# Patient Record
Sex: Female | Born: 1965 | Race: Black or African American | Hispanic: No | Marital: Married | Smoking: Never smoker
Health system: Southern US, Community
[De-identification: ages and names within clinical notes are randomized; demographics above are authoritative.]

## PROBLEM LIST (undated history)

## (undated) DIAGNOSIS — I10 Essential (primary) hypertension: Secondary | ICD-10-CM

## (undated) DIAGNOSIS — L509 Urticaria, unspecified: Secondary | ICD-10-CM

## (undated) DIAGNOSIS — E119 Type 2 diabetes mellitus without complications: Secondary | ICD-10-CM

## (undated) HISTORY — DX: Urticaria, unspecified: L50.9

## (undated) HISTORY — PX: TUBAL LIGATION: SHX77

## (undated) HISTORY — PX: BREAST SURGERY: SHX581

---

## 2001-02-19 ENCOUNTER — Other Ambulatory Visit: Admission: RE | Admit: 2001-02-19 | Discharge: 2001-02-19 | Payer: Self-pay | Admitting: *Deleted

## 2001-04-05 ENCOUNTER — Inpatient Hospital Stay (HOSPITAL_COMMUNITY): Admission: RE | Admit: 2001-04-05 | Discharge: 2001-04-06 | Payer: Self-pay | Admitting: *Deleted

## 2002-06-12 ENCOUNTER — Other Ambulatory Visit: Admission: RE | Admit: 2002-06-12 | Discharge: 2002-06-12 | Payer: Self-pay | Admitting: Obstetrics and Gynecology

## 2002-06-17 ENCOUNTER — Encounter: Payer: Self-pay | Admitting: Obstetrics and Gynecology

## 2002-06-17 ENCOUNTER — Encounter: Admission: RE | Admit: 2002-06-17 | Discharge: 2002-06-17 | Payer: Self-pay | Admitting: Obstetrics and Gynecology

## 2002-09-11 ENCOUNTER — Encounter: Payer: Self-pay | Admitting: Obstetrics and Gynecology

## 2002-09-11 ENCOUNTER — Ambulatory Visit (HOSPITAL_COMMUNITY): Admission: RE | Admit: 2002-09-11 | Discharge: 2002-09-11 | Payer: Self-pay | Admitting: Obstetrics and Gynecology

## 2003-03-23 ENCOUNTER — Ambulatory Visit (HOSPITAL_COMMUNITY): Admission: RE | Admit: 2003-03-23 | Discharge: 2003-03-23 | Payer: Self-pay | Admitting: Obstetrics and Gynecology

## 2003-04-16 ENCOUNTER — Encounter (HOSPITAL_COMMUNITY): Admission: AD | Admit: 2003-04-16 | Discharge: 2003-05-16 | Payer: Self-pay | Admitting: Obstetrics and Gynecology

## 2003-04-21 ENCOUNTER — Encounter: Admission: RE | Admit: 2003-04-21 | Discharge: 2003-04-21 | Payer: Self-pay | Admitting: Obstetrics and Gynecology

## 2003-05-22 ENCOUNTER — Encounter (HOSPITAL_COMMUNITY): Admission: RE | Admit: 2003-05-22 | Discharge: 2003-06-21 | Payer: Self-pay | Admitting: Obstetrics and Gynecology

## 2003-06-01 ENCOUNTER — Inpatient Hospital Stay (HOSPITAL_COMMUNITY): Admission: RE | Admit: 2003-06-01 | Discharge: 2003-06-23 | Payer: Self-pay | Admitting: Obstetrics and Gynecology

## 2003-06-22 ENCOUNTER — Encounter (INDEPENDENT_AMBULATORY_CARE_PROVIDER_SITE_OTHER): Payer: Self-pay | Admitting: *Deleted

## 2003-06-24 ENCOUNTER — Encounter: Admission: RE | Admit: 2003-06-24 | Discharge: 2003-07-24 | Payer: Self-pay | Admitting: Obstetrics and Gynecology

## 2003-07-25 ENCOUNTER — Encounter: Admission: RE | Admit: 2003-07-25 | Discharge: 2003-08-24 | Payer: Self-pay | Admitting: Obstetrics and Gynecology

## 2003-08-03 ENCOUNTER — Other Ambulatory Visit: Admission: RE | Admit: 2003-08-03 | Discharge: 2003-08-03 | Payer: Self-pay | Admitting: Obstetrics and Gynecology

## 2004-03-06 IMAGING — US US OB FOLLOW-UP
1 series · 18 of 28 positions shown · non-contrast
Comparison: none

CLINICAL DATA: 25 week 4 day assigned gestational age.  Measuring large for dates.  Premature rupture of membranes.  Evaluate fetal growth, biophysical profile and dopplers.

[Series 1: us ob re-eval · 18 of 29 slices shown]
[im 1/29]
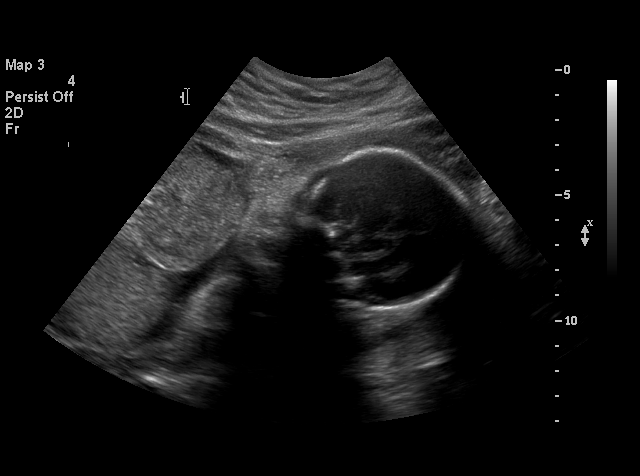
[im 3/29]
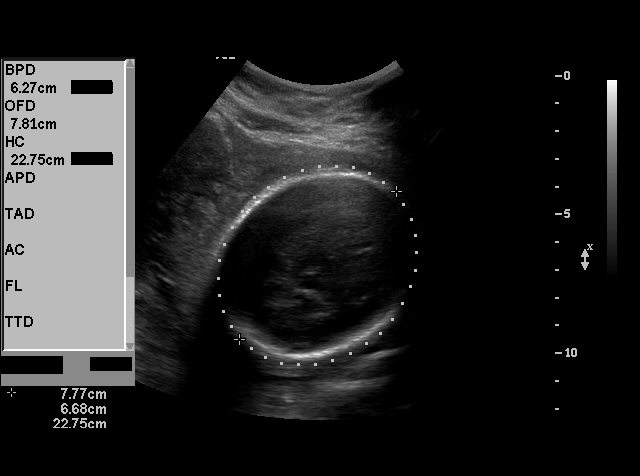
[im 4/29]
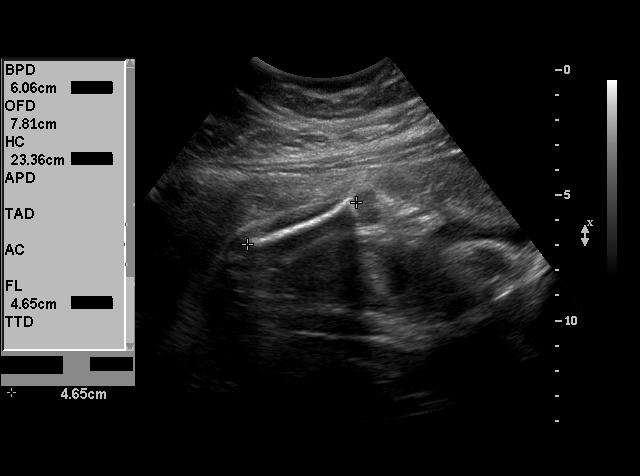
[im 6/29]
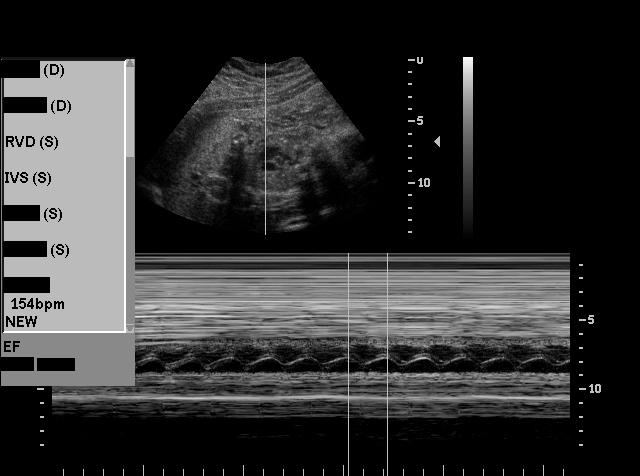
[im 8/29]
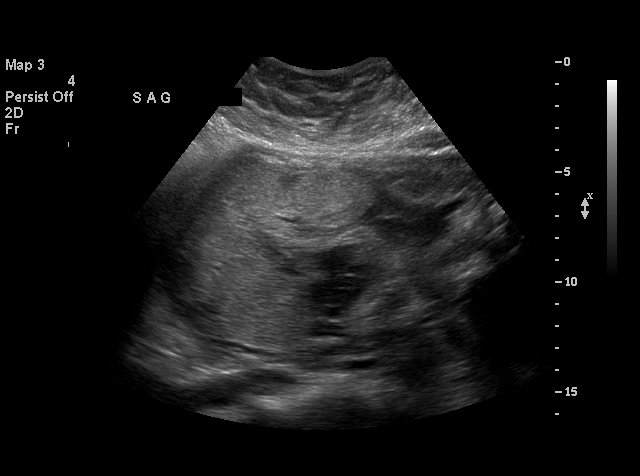
[im 9/29]
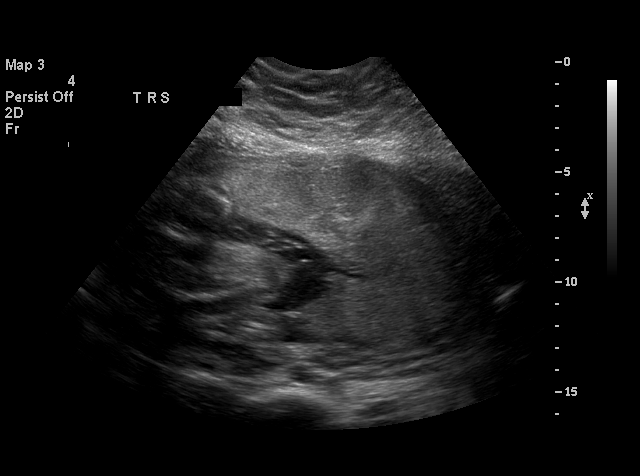
[im 11/29]
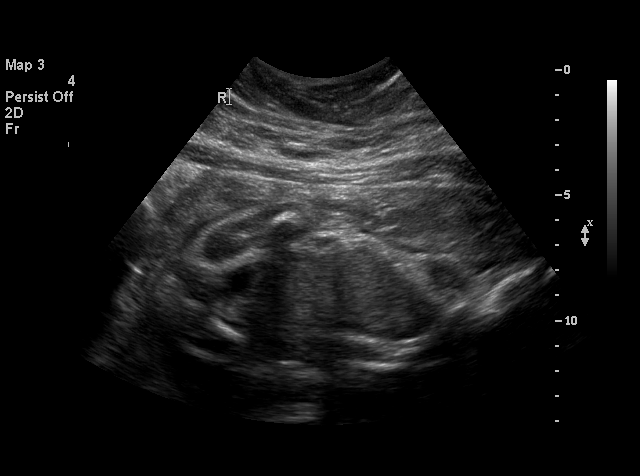
[im 12/29]
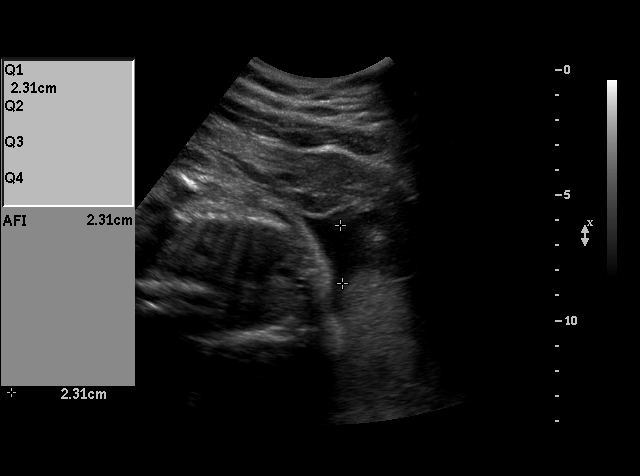
[im 14/29]
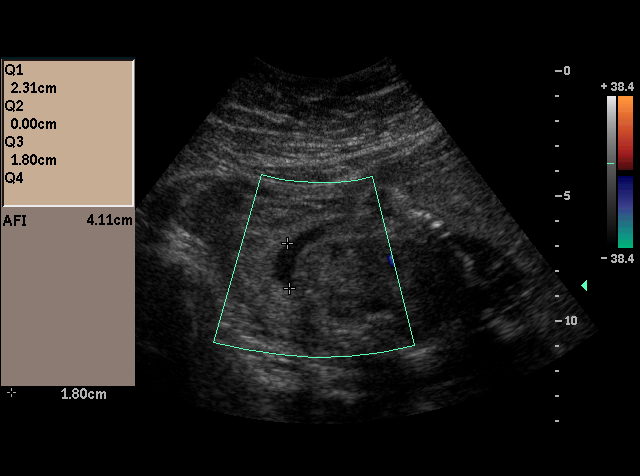
[im 15/29]
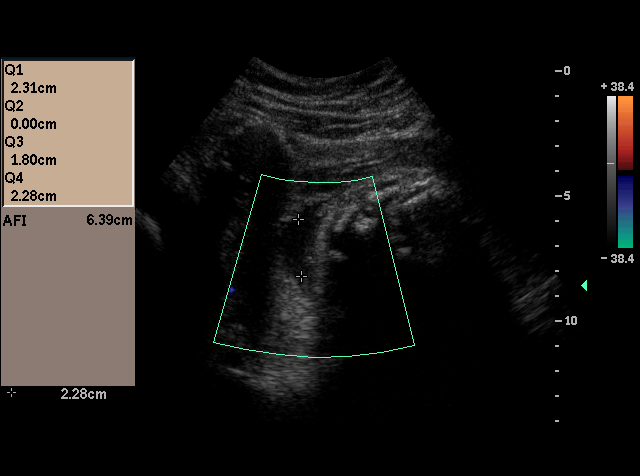
[im 17/29]
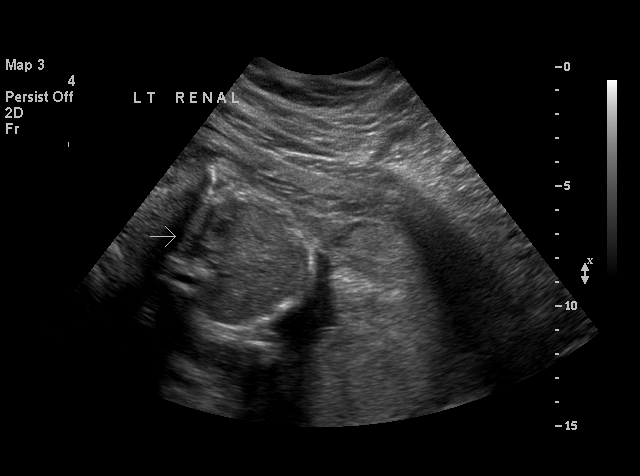
[im 18/29]
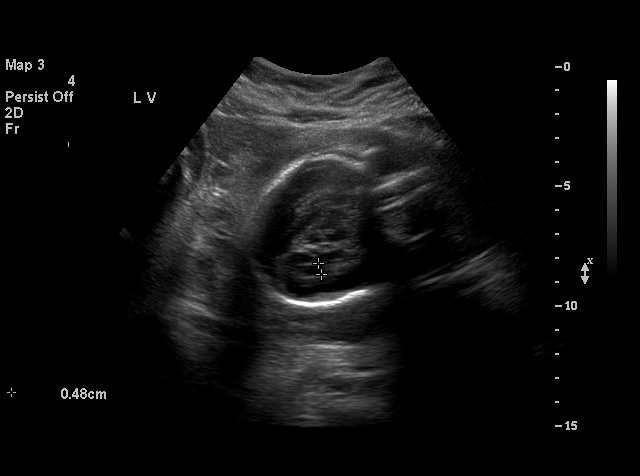
[im 20/29]
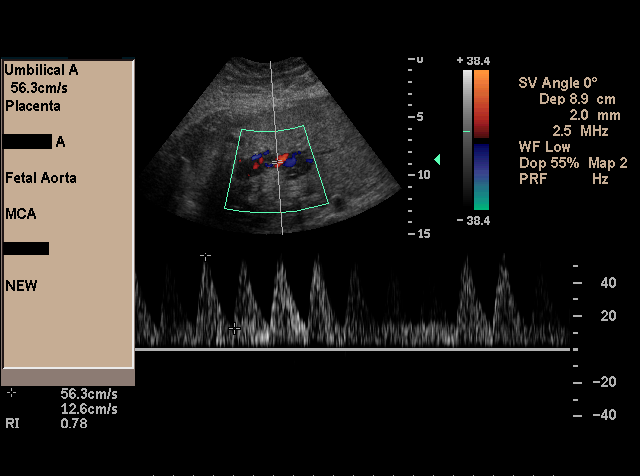
[im 22/29]
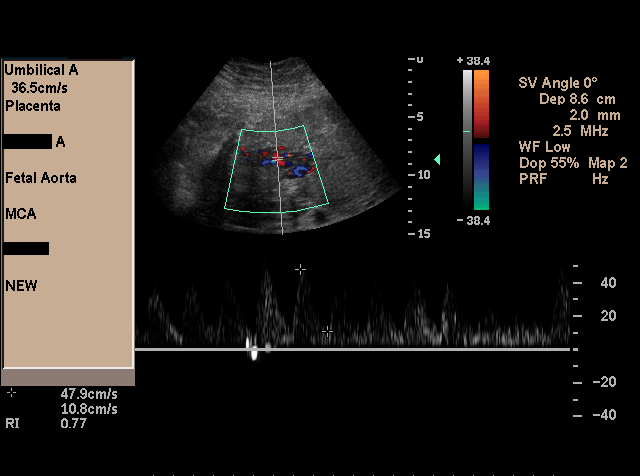
[im 23/29]
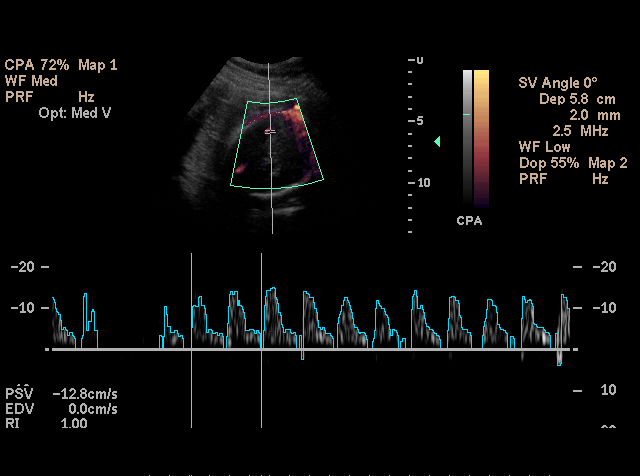
[im 25/29]
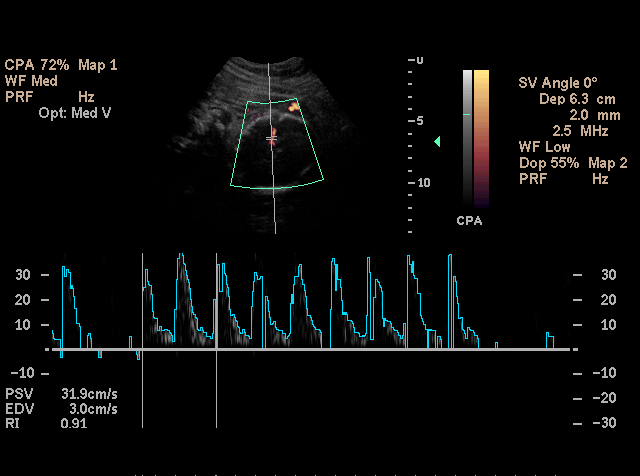
[im 26/29]
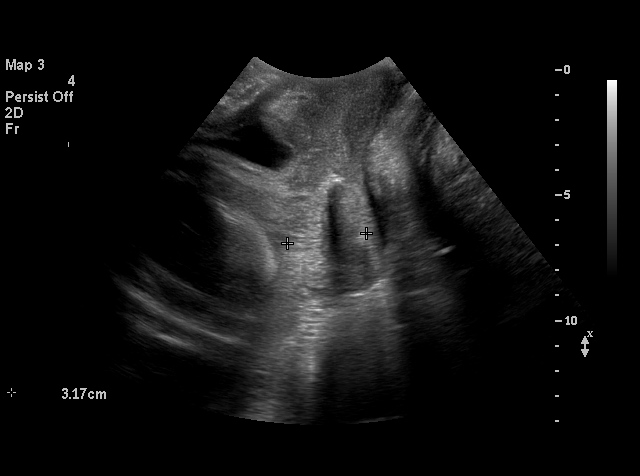
[im 29/29]
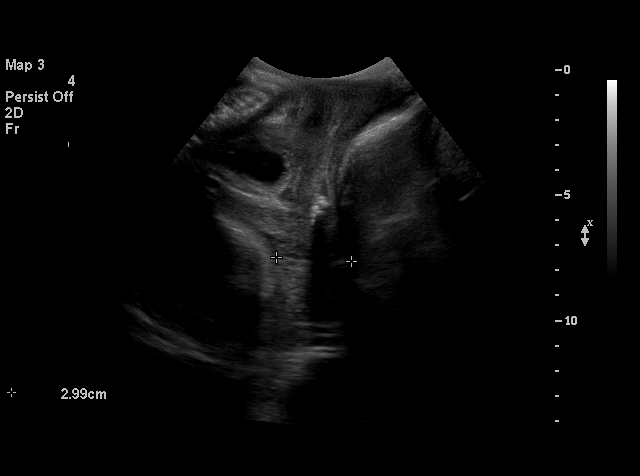

[18 of 28 positions shown; findings below may reference images not displayed]

OBSTETRICAL ULTRASOUND RE-EVALUATION

NUMBER OF FETUSES:  1
HEART RATE:  154
MOVEMENT:  Yes
BREATHING:  Yes
PRESENTATION:  Cephalic
PLACENTAL LOCATION:  Fundal
GRADE:  II
PREVIA:  No
AMNIOTIC FLUID (subjective):  Decreased
AMNIOTIC FLUID (objective):  6.4 cm AFI (5th -95th%ile =   9.7 ? 22.3 cm for 26 weeks

FETAL BIOMETRY
BPD:   6.3 cm  25 w 3 d
HC:  22.8 cm  24 w 6 d
AC:  19.3 cm  24 w 1 d
FL:  4.7 cm  25 w 4 d
MEAN GA:  25 w 0 d
ASSIGNED GA:  25 w 4 d
EFW:  734 g (H) 25th 50th%ile (652 ? 760 g) For 26 weeks

FETAL ANATOMY
LATERAL VENTRICLES:  Visualized 
THALAMI/CSP:  Visualized 
POSTERIOR FOSSA:  Previously seen 
NUCHAL REGION:  N/A
SPINE:  Previously seen 
4 CHAMBER HEART ON LEFT:  Previously seen 
STOMACH ON LEFT:  Visualized 
3 VESSEL CORD:  Previously seen 
CORD INSERTION SITE:  Previously seen 
KIDNEYS:  Visualized 
BLADDER:  Visualized 
EXTREMITIES:  Previously seen 

MATERNAL FINDINGS
CERVIX:  3.0 cm Translabially
IMPRESSION: Assigned gestational age is currently 25 weeks 4 days. Appropriate fetal growth, with EFW between 25th and 50th percentile.
Decreased amniotic fluid volume, with AFI of 6.4 cm.  This is attributed to the patient?s history of ruptured membranes.  
BIOPHYSICAL PROFILE

MOVEMENTS:  2  Time:  20 minutes
BREATHING:  0 (breathing seen, but not sustained)
TONE:  2
AMNIOTIC FLUID:  2
TOTAL SCORE:  6
IMPRESSION
Biophysical profile score [DATE].

DOPPLER ULTRASOUND OF FETUS

UMBILICAL ARTERY
S/D RATIO:  4.23  (NL < 5.00)
MIDDLE CEREBRAL ARTERY
PI:  2.19  (NL > 1.54)
IMPRESSION
Normal fetal doppler measurements.

## 2004-03-08 IMAGING — US US OB LIMITED
1 series · 18 of 18 positions shown · non-contrast
Comparison: Obstetrical ultrasound 06/18/03.

CLINICAL DATA: 37-year-old [id number], with history of oligohydramnios and shortened cervix.

LIMITED OBSTETRICAL ULTRASOUND AND FETAL BIOPHYSICAL PROFILE WITHOUT NONSTRESS TESTING, 06/20/03

[Series 1: us fetal bpp w/o nonstress · non-contrast · 18 of 18 slices shown]
[im 1/18]
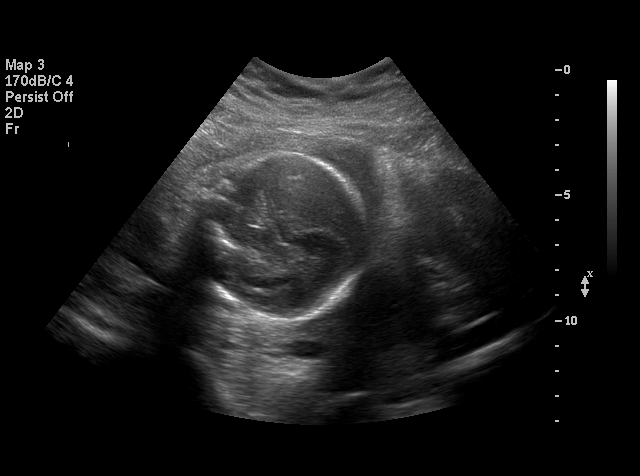
[im 2/18]
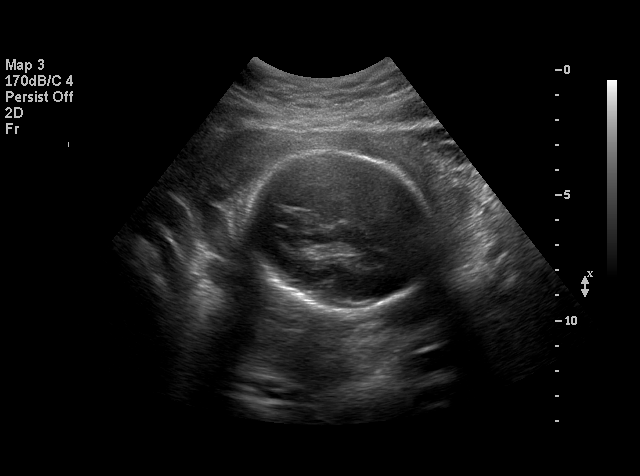
[im 3/18]
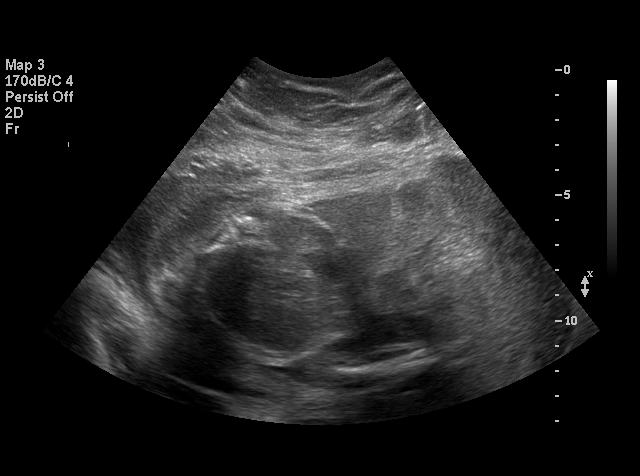
[im 4/18]
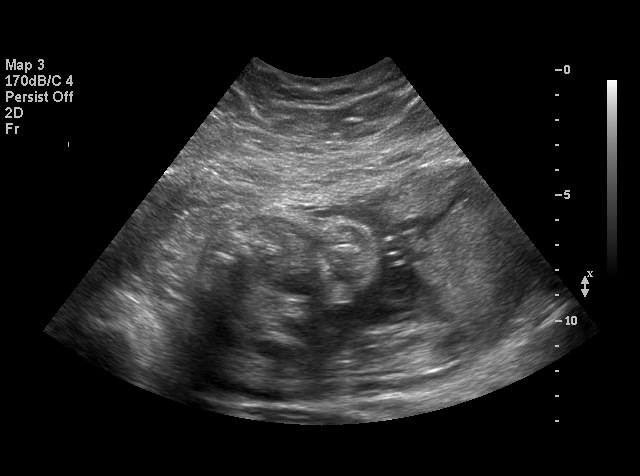
[im 5/18]
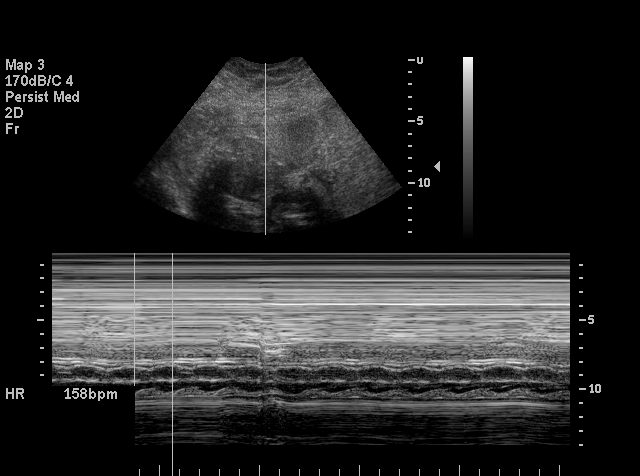
[im 6/18]
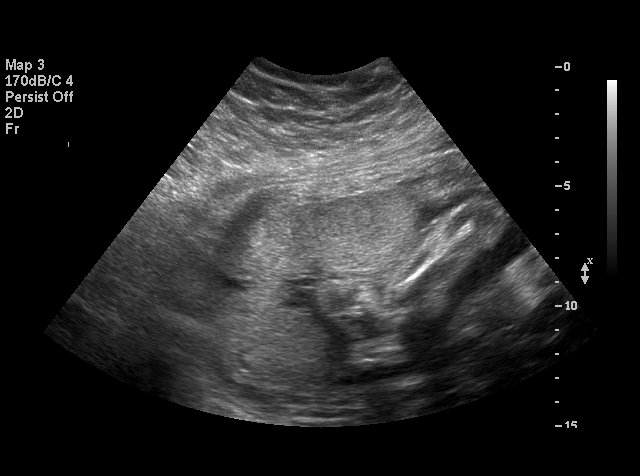
[im 7/18]
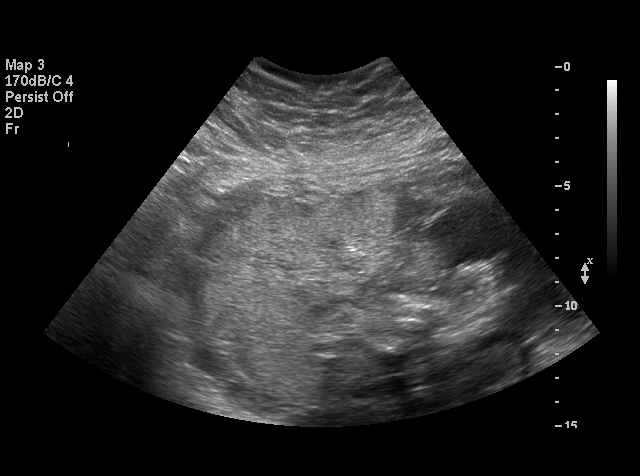
[im 8/18]
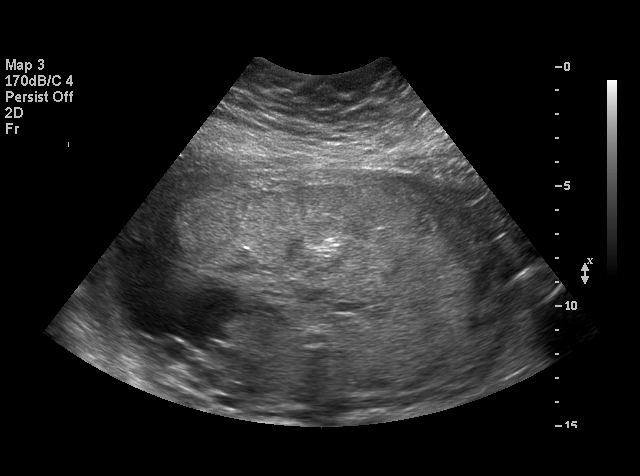
[im 9/18]
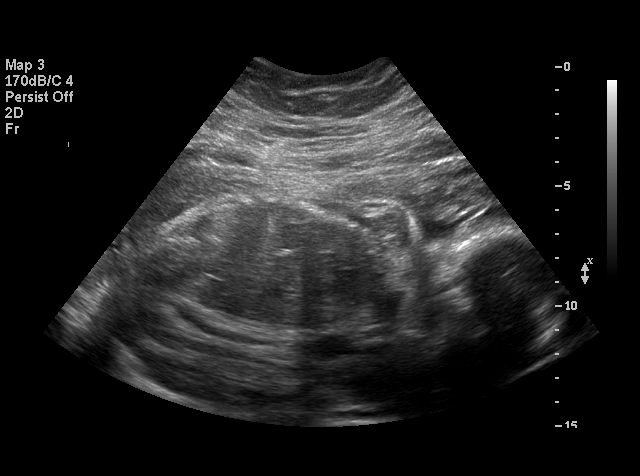
[im 10/18]
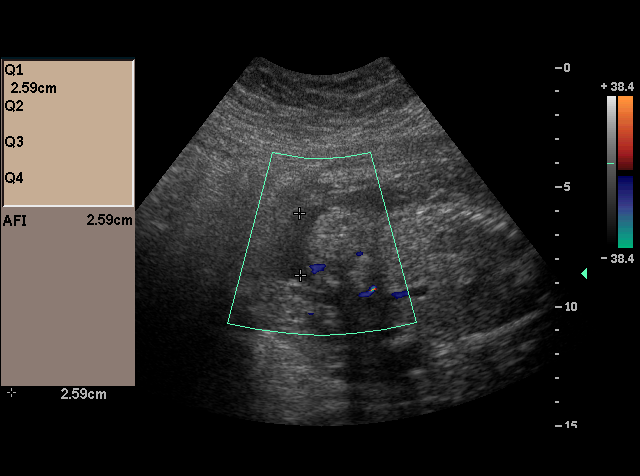
[im 11/18]
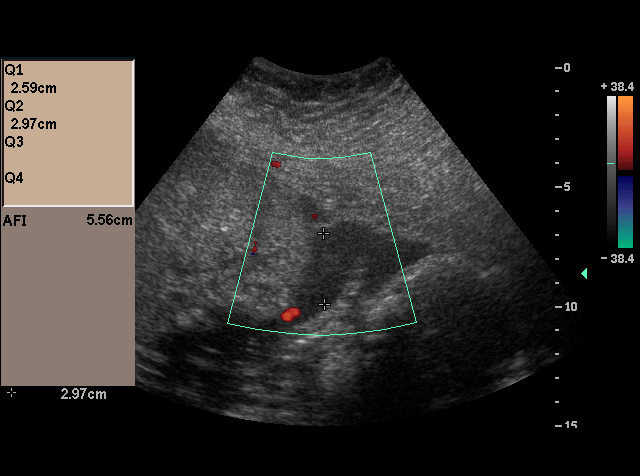
[im 12/18]
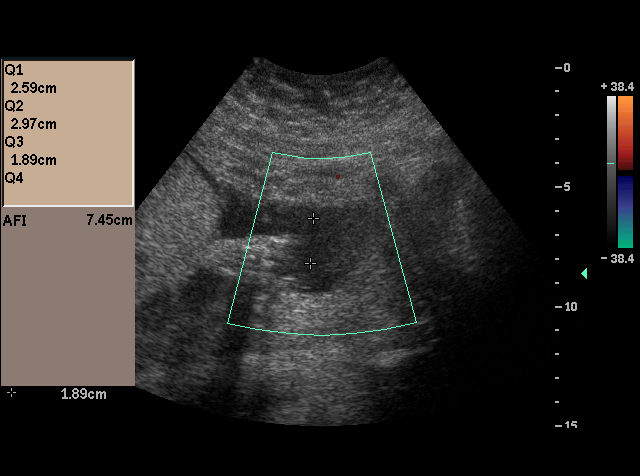
[im 13/18]
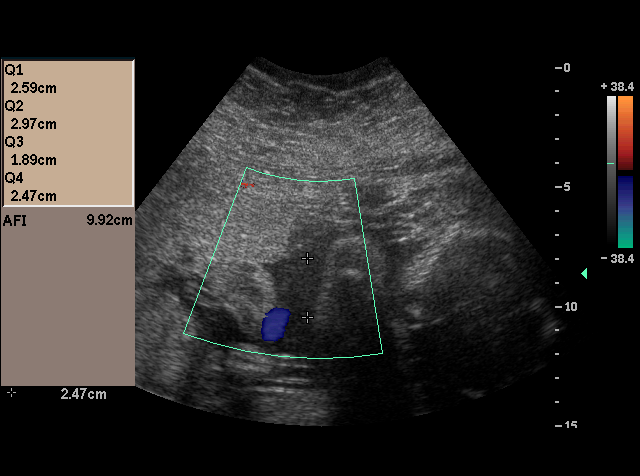
[im 14/18]
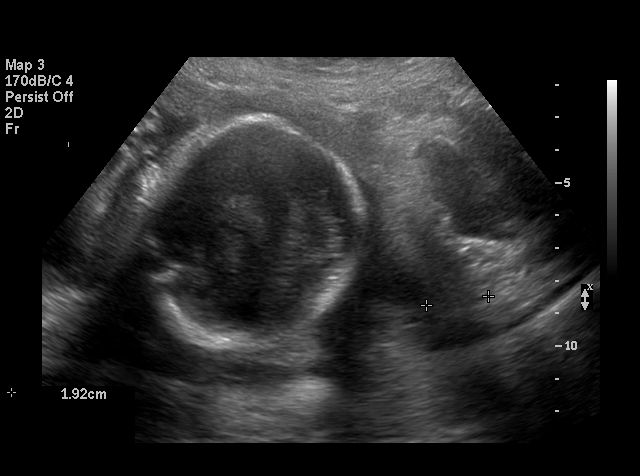
[im 15/18]
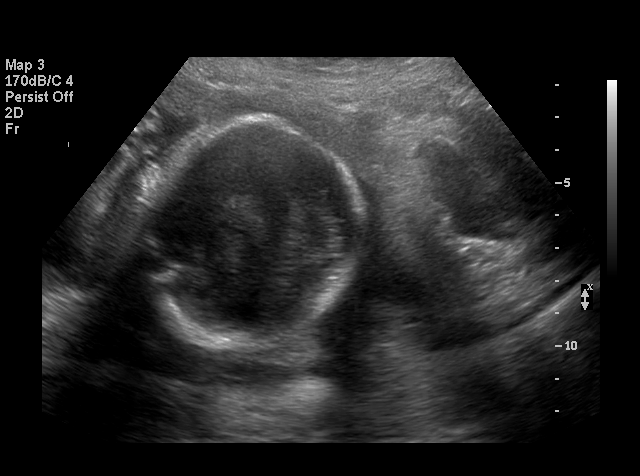
[im 16/18]
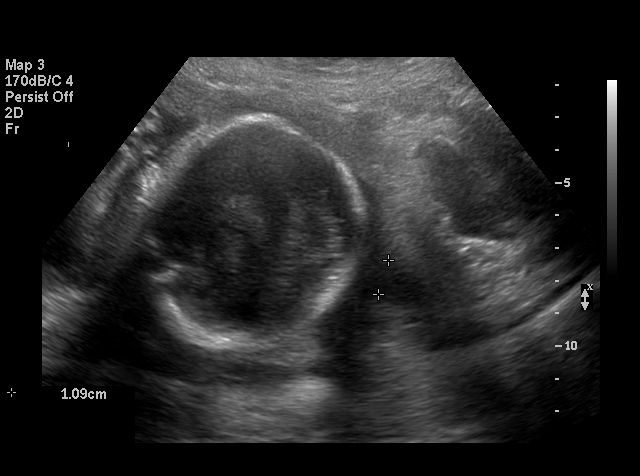
[im 17/18]
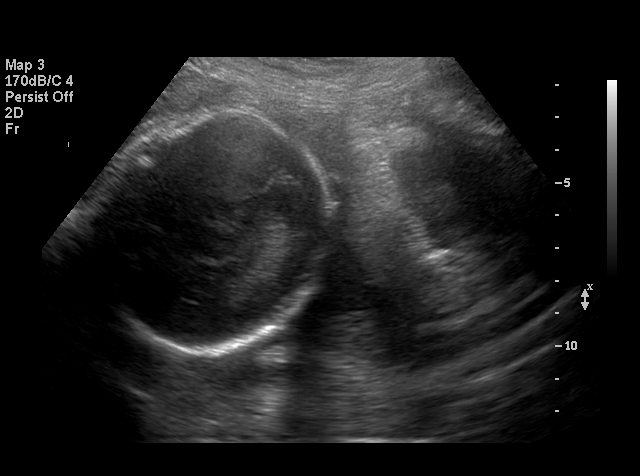
[im 18/18]
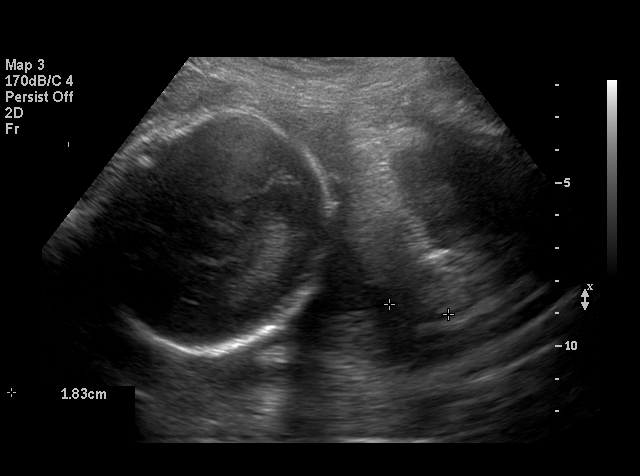

[18 of 18 positions shown; findings below may reference images not displayed]

NUMBER OF FETUSES:  1
HEART RATE:  158 BPM
MOVEMENT:  Yes
BREATHING:  Yes
PRESENTATION:  Cephalic
PLACENTAL LOCATION:  Fundal
GRADE:  II
PREVIA:  No
AMNIOTIC FLUID (SUBJECTIVE):  Decreased
AMNIOTIC FLUID (OBJECTIVE):  9.9 cm vertical pocket

Fetal measurements and complete anatomic evaluation were not requested on this exam.  The following fetal anatomy was visualized today:  Lateral ventricles, stomach, bladder, and diaphragm.

BPP SCORING  (10 minutes)
MOVEMENTS:  2
BREATHING:  2  
TONE:  2
AMNIOTIC FLUID:  2
TOTAL SCORE:  8

MATERNAL FINDINGS:
CERVIX:  1.9 cm translabially
IMPRESSION: Single living intrauterine fetus in cephalic presentation.
Oligohydramnios.
Shortened cervix measuring 1.9 cm which appears open by approximately 1.1 cm at the internal os.
Fetal biophysical profile score [DATE] of a possible 8 in 10 minutes.

## 2005-02-20 ENCOUNTER — Other Ambulatory Visit: Admission: RE | Admit: 2005-02-20 | Discharge: 2005-02-20 | Payer: Self-pay | Admitting: Obstetrics and Gynecology

## 2006-05-17 ENCOUNTER — Other Ambulatory Visit: Admission: RE | Admit: 2006-05-17 | Discharge: 2006-05-17 | Payer: Self-pay | Admitting: Obstetrics and Gynecology

## 2009-01-11 ENCOUNTER — Emergency Department (HOSPITAL_COMMUNITY): Admission: EM | Admit: 2009-01-11 | Discharge: 2009-01-11 | Payer: Self-pay | Admitting: Emergency Medicine

## 2010-04-20 ENCOUNTER — Ambulatory Visit (HOSPITAL_COMMUNITY): Admission: RE | Admit: 2010-04-20 | Discharge: 2010-04-20 | Payer: Self-pay | Admitting: Family Medicine

## 2010-05-13 ENCOUNTER — Ambulatory Visit (HOSPITAL_COMMUNITY): Admission: RE | Admit: 2010-05-13 | Discharge: 2010-05-13 | Payer: Self-pay | Admitting: Obstetrics and Gynecology

## 2010-06-21 ENCOUNTER — Ambulatory Visit
Admission: RE | Admit: 2010-06-21 | Discharge: 2010-06-21 | Payer: Self-pay | Source: Home / Self Care | Attending: Plastic Surgery | Admitting: Plastic Surgery

## 2010-08-01 ENCOUNTER — Encounter: Payer: Self-pay | Admitting: Obstetrics and Gynecology

## 2010-09-20 LAB — POCT HEMOGLOBIN-HEMACUE: Hemoglobin: 11.4 g/dL — ABNORMAL LOW (ref 12.0–15.0)

## 2010-09-20 LAB — BASIC METABOLIC PANEL
BUN: 10 mg/dL (ref 6–23)
BUN: 8 mg/dL (ref 6–23)
CO2: 26 mEq/L (ref 19–32)
CO2: 27 mEq/L (ref 19–32)
Calcium: 9.5 mg/dL (ref 8.4–10.5)
Calcium: 9.5 mg/dL (ref 8.4–10.5)
Chloride: 101 mEq/L (ref 96–112)
Chloride: 101 mEq/L (ref 96–112)
Creatinine, Ser: 0.89 mg/dL (ref 0.4–1.2)
Creatinine, Ser: 0.86 mg/dL (ref 0.4–1.2)
GFR calc Af Amer: >60 mL/min (ref >60)
GFR calc Af Amer: >60 mL/min (ref >60)
GFR calc non Af Amer: >60 mL/min (ref >60)
GFR calc non Af Amer: >60 mL/min (ref >60)
Glucose, Bld: 147 mg/dL — ABNORMAL HIGH (ref 70–99)
Glucose, Bld: 137 mg/dL — ABNORMAL HIGH (ref 70–99)
Potassium: 4.2 mEq/L (ref 3.5–5.1)
Potassium: 4.4 mEq/L (ref 3.5–5.1)
Sodium: 134 mEq/L — ABNORMAL LOW (ref 135–145)
Sodium: 133 mEq/L — ABNORMAL LOW (ref 135–145)

## 2010-09-20 LAB — SURGICAL PCR SCREEN
MRSA, PCR: NEGATIVE
Staphylococcus aureus: POSITIVE — AB

## 2010-09-20 LAB — CBC
HCT: 37.5 % (ref 36.0–46.0)
Hemoglobin: 12.4 g/dL (ref 12.0–15.0)
MCH: 30.8 pg (ref 26.0–34.0)
MCHC: 33 g/dL (ref 30.0–36.0)
MCV: 93.1 fL (ref 78.0–100.0)
Platelets: 437 10*3/uL — ABNORMAL HIGH (ref 150–400)
RBC: 4.03 MIL/uL (ref 3.87–5.11)
RDW: 13.3 % (ref 11.5–15.5)
WBC: 8.2 10*3/uL (ref 4.0–10.5)

## 2010-09-20 LAB — HCG, SERUM, QUALITATIVE: Preg, Serum: NEGATIVE

## 2010-11-25 NOTE — H&P (Signed)
NAME:  Locy, Bona           ACCOUNT NO.:  [id number]   MEDICAL RECORD NO.:  [id number]          PATIENT TYPE:  AMB   LOCATION:  SDC                           FACILITY:  [facility]   PHYSICIAN:  Osborn Coho, M.D.   DATE OF BIRTH:  [birth year]   DATE OF ADMISSION:  DATE OF DISCHARGE:                                HISTORY & PHYSICAL   HISTORY OF PRESENT ILLNESS:  Ms. Stanwood is a 45 year old married African  American female, para 1-0-3-1, who presents for laparoscopic tubal cautery  because of her desire for permanent sterilization.  The patient had negative  Gonorrhea and Chlamydia cultures October 05, 2005.   OB HISTORY:  Gravida 4, para 1-0-3-1, patient has history of recurrent  pregnancy losses in the second trimester and incompetent cervix and an  ectopic pregnancy.   GYN HISTORY:  Menarche 45 years old, last menstrual period April 2007,  denies any history of sexually transmitted diseases, does have a history of  abnormal Pap smear for which she received a conization of her cervix.  Her  last normal Pap smear was August 2006.   PAST MEDICAL HISTORY:  Gestational diabetes and uterine fibroids.   PAST SURGICAL HISTORY:  1989, laser conization of her cervix.  1993, right  salpingostomy.  1996, D&C for retained products of conception.   FAMILY HISTORY:  Positive for hypertension, diabetes mellitus, brain and  stomach cancers.   SOCIAL HISTORY:  The patient is married.   HABITS:  She does not use tobacco and alcohol.   CURRENT MEDICATIONS:  Lo-Estrin 24 FE, one tablet daily.   ALLERGIES:  PENICILLIN which causes a rash.   REVIEW OF SYSTEMS:  The patient has a history of an IUD which was removed  October 05, 2005.  She denies any chest pain, shortness of breath, vaginitis  symptoms, nausea, vomiting, diarrhea, or pelvic pain.   PHYSICAL EXAMINATION:  VITAL SIGNS:  Blood pressure 120/90, weight 270 pounds, height 5 feet 7  inches tall.  NECK:  Supple without masses.  HEART:  Regular rate and rhythm, no murmur.  LUNGS:  Clear to auscultation.  ABDOMEN:  Bowel sounds present, soft, without tenderness, guarding, rebound,  or organomegaly.  EXTREMITIES:  Without cyanosis, clubbing, and edema.  PELVIC EXAM:  EG and BUS is normal.  Vagina normal.  Cervix nontender  without lesions.  Uterus normal size, shape, and consistency without  tenderness.  Adnexa without tenderness or masses.   IMPRESSION:  1.  Desire for sterilization.  2.  Previous abdominal surgery.   DISPOSITION:  A discussion was held with the patient regarding the  indications for her procedure along with its risks which include but are not  limited to reaction to anesthesia, damage to adjacent organs, infection,  excessive bleeding, that the procedure is considered permanent, that should  failure occur her risk of ectopic does increase.  The patient verbalized  understanding of these risks and accepts them.  She has consented to proceed  with laparoscopic tubal cautery at [facility] [place] on December 08, 2005, at 7:30 a.m.  Surgery was not done secondary to patient changed  her  mind.      [name] J. Adline Peals.      Osborn Coho, M.D.  Electronically Signed    EJP/MEDQ  D:  11/29/2005  T:  11/29/2005  Job:  [id number]

## 2010-11-25 NOTE — Op Note (Signed)
   NAME:  Tonya Calhoun, Tonya Calhoun                     ACCOUNT NO.:  [id number]   MEDICAL RECORD NO.:  [id number]                   PATIENT TYPE:  AMB   LOCATION:  SDC                                  [facility]ILITY:  [facility]   PHYSICIAN:  Dois Davenport A. Rivard, M.D.              DATE OF BIRTH:  11/18/1965   DATE OF PROCEDURE:  03/23/2003  DATE OF DISCHARGE:                                 OPERATIVE REPORT   PREOPERATIVE DIAGNOSIS:  Intrauterine pregnancy at 13 weeks 3 days with  cervical incompetence.   POSTOPERATIVE DIAGNOSIS:  Intrauterine pregnancy at 13 weeks 3 days with  cervical incompetence, with uterine prolapse.   ANESTHESIA:  Spinal, Burnett Corrente, M.D.   PROCEDURE:  Cervical cerclage.   SURGEON:  Crist Fat. Rivard, M.D.   ESTIMATED BLOOD LOSS:  Less than 100 mL.   DESCRIPTION OF PROCEDURE:  After being informed of the planned procedure  with possible complications including bleeding, infection, and miscarriage,  informed consent was obtained.  The patient is taken to OR #4, given spinal  anesthesia without any difficulty, and placed in the lithotomy position.  She is prepped and draped in a sterile fashion and her bladder is emptied  with an in-and-out catheter.  GYN exam reveals a uterine prolapse 3/4 with a  closed but flared cervix, with a cervical length of about 3 cm.  Cervix is  at the introitus.  A weighted speculum is inserted, anterior lip of the  cervix is grasped with a ring forceps, and using a Mersilene mesh we proceed  with cerclage in the usual fashion, trying to keep the Mersilene mesh  submucosal.  The knot is tied at 12 o'clock, and a 0 Prolene is tied within  the knot to improve ease of removal at the time of cervical cerclage  removal.  Hemostasis is adequate.  Instrument and sponge count is complete  x2.  Estimated blood loss is less than 100 mL.  The patient is well-  tolerated by the patient, who is taken to the recovery room in a well and  stable condition.   Please note that the patient received a Clindamycin 900  mg IV dose intraoperatively.                                                Crist Fat Rivard, M.D.    SAR/MEDQ  D:  03/23/2003  T:  03/23/2003  Job:  [id number]

## 2010-11-25 NOTE — Op Note (Signed)
NAME:  Snethen, ROXI HLAVATY                     ACCOUNT NO.:  [id number]   MEDICAL RECORD NO.:  [id number]                   PATIENT TYPE:  INP   LOCATION:  9303                                 [facility]ACILITY:  [facility]   PHYSICIAN:  Osborn Coho, M.D.                DATE OF BIRTH:  Jul 23, 1965   DATE OF PROCEDURE:  06/21/2003  DATE OF DISCHARGE:  06/23/2003                                 OPERATIVE REPORT   PREOPERATIVE DIAGNOSES:  1. Status post vaginal delivery.  2. Probable cervical laceration.   POSTOPERATIVE DIAGNOSES:  1. Cervical laceration.  2. Retained products of conception.  3. Status post vaginal delivery.   PROCEDURE:  Repair of cervical laceration and curettage.   ANESTHESIA:  MAC.   FLUIDS:  1000 mL.   ESTIMATED BLOOD LOSS:  50-100 mL.   URINE OUTPUT:  Straight cath prior to procedure approximately 100 mL.   COMPLICATIONS:  None.   FINDINGS:  The anterior lip of the cervix with laceration where cerclage was  initially located.  Right cervical laceration approximately 1.5 cm with  bleeding upon exploration.  Small left cervical laceration not bleeding and  not repaired.  Lacerations repaired with 0 Vicryl.  Pitocin infusing.  Fundal massage given.  Minimal bleeding noted at the completion of the  procedure.   DESCRIPTION OF PROCEDURE:  The patient was taken to the operating room after  the risks, benefits, and alternatives were discussed with the patient.  The  patient verbalized understanding and consent previously signed and  witnessed.  The patient was given a MAC for anesthesia and prepped and  draped in the normal sterile fashion.  Vaginal wall retractors were place to  visualize the cervix, and the cervix was visualized with the findings as  noted above.  Repair of the cervical laceration was performed with 0 Vicryl  in a running lock fashion.  Sharp curettage was performed to remove the  remaining products of conception following the vaginal delivery.   Portions  sent to pathology.  There was minimal bleeding noted at the end of the  procedure.  Sponge, lap, and needle count was correct.  The patient  tolerated the procedure well and was returned to the recovery room in stable  condition.                                               Osborn Coho, M.D.   AR/MEDQ  D:  07/21/2003  T:  07/21/2003  Job:  [id number]

## 2010-11-25 NOTE — Discharge Summary (Signed)
[facility]  Patient:    DIA, DONATE Visit Number: [id number] MRN: [id number]          Service Type: OBS Location: 4 A416 01 Attending Physician:  Jeri Cos. Dictated by:   Langley Gauss, M.D. Admit Date:  04/05/2001 Discharge Date: 04/06/2001                             Discharge Summary  DISCHARGE DIAGNOSIS:  A 17.5 week intrauterine pregnancy, spontaneous rupture of membranes, inevitable second trimester abortion.  PROCEDURES:  Limited transabdominal ultrasound performed by Dr. Roylene Reason. Lisette Grinder, reveals a single intrauterine pregnancy in double footling breech presentation, minimal amniotic fluid present.  No fetal cardiac activity identified and no fetal movement identified.  HOSPITAL COURSE:  The patient had medically indicated evacuation of uterus on April 05, 2001.  Had no post partum complications.  Pertinent pathology reveals previable fetus and placenta with membrane and umbilical cord.  Small for gestational age.  Also, blood clots per vagina.  PERTINENT LABORATORY DATA:  Hemoglobin and hematocrit 10.3/29.5 with a white count 11.1.  On postpartum day #1 9.5/27.2 with a white count of 16.2.  RPR is nonreactive, rubella is nonimmune.  The patient was discharged home April 06, 2001. Dictated by:   Langley Gauss, M.D. Attending Physician:  Jeri Cos. DD:  06/19/01 TD:  06/19/01 Job: [id number]

## 2010-11-25 NOTE — Discharge Summary (Signed)
NAME:  Tonya Calhoun, Tonya Calhoun                     ACCOUNT NO.:  [id number]   MEDICAL RECORD NO.:  [id number]                   PATIENT TYPE:  INP   LOCATION:  9303                                 [facility]ILITY:  [facility]   PHYSICIAN:  Osborn Coho, M.D.                DATE OF BIRTH:  1965-09-29   DATE OF ADMISSION:  06/01/2003  DATE OF DISCHARGE:  06/23/2003                                 DISCHARGE SUMMARY   ADMISSION DIAGNOSES:  1. Intrauterine pregnancy at 23-1/7 weeks.  2. Incompetent cervix with a cerclage.  3. Breech presentation.  4. Vaginal candidiasis.  5. Insulin-dependent gestational diabetes.   DISCHARGE DIAGNOSES:  1. Intrauterine pregnancy status post delivery at [redacted] weeks gestation.  2. Premature labor.  3. Incompetent cervix.  4. Status post cervical laceration.  5. Status post normal spontaneous vaginal delivery, vertex presentation.  6. Breast pumping.  7. Asymptomatic anemia.  8. Insulin-dependent gestational diabetes continuing on sliding scale     insulin.  9. Undecided regarding contraception.   PROCEDURE:  1. Normal spontaneous vaginal delivery of a viable female infant named Donovan Kail     who had Apgars of 2 at one minute and 7 at five minutes and weighed 1     pound 5.9 ounces which is 621 g on June 21, 2003 attended in delivery     by Osborn Coho, M.D.  2. Repair of cervical laceration and curettage for retained products of     conception following delivery on June 21, 2003 also by Osborn Coho, M.D.   HOSPITAL COURSE:  Ms. Cotta is a 45 year old African-American female  gravida 4, para 0-0-3-0 who presented on June 01, 2003 for her routine  visit.  On ultrasound was noted to have cervical dilation 1-2 cm with  membranes dumbelling through the os and cerclage on ultrasound.  She was  admitted for tocolysis and Trendelenburg positioning and was continued in  that position for several days.  At 24 weeks she received betamethasone  series  and continued to be maintained on strict bed rest to maximize her  pregnancy.  She, however, on December 12 early in the morning started  complaining of abdominal pain and seemed to be complaining of contractions.  She was then started on magnesium sulfate therapy for tocolysis.  However,  she continued to contract and started bleeding around 7:40 in the morning  with bright red blood.  She was then noted to be spontaneously pushing and  proceeded to delivery of a viable female infant named Donovan Kail who weighed 621  g and had Apgars of 2 at one minute and 7 at five minutes.  The infant was  taken to the NICU in stable condition.  The patient still had an intact  cerclage in place and was noted to have a cervical laceration from where it  pulled through and was taken to the operating room for repair of the  cervical  laceration.  She was also noted to have retained products of  conception and underwent curettage for removal of those.  She tolerated both  procedures well and is now ambulating, voiding, and eating without  difficulty.  She has no complaints of dizziness or syncope and other than  feeling tired and occasionally having a headache she feels well.  She is  interested in discharge today.  Her blood sugars have been slightly elevated  recently and they are being managed with sliding scale Regular insulin.  She  desires to breast-feed and is currently pumping.  She is currently also  undecided regarding contraception.   DISCHARGE PLAN:  Leave the hospital later today after a repeat CBC if the  hematocrit and hemoglobin are stable.   DISCHARGE MEDICATIONS:  1. Motrin 600 mg p.o. q.6h. p.r.n. for pain.  2. Tylox one to two p.o. q.4-6h. p.r.n. for pain.  3. Prenatal vitamins daily.  4. Insulin per sliding scale.  She is to receive 2 units if her blood sugars     are between 121-130, 4 units if they are between 131-140, 6 units if they     are 141-150, and 8 units if they are 151-160, 10  units if they are 160-     200.  If her blood sugar is greater than 200 she is to call the on-call     physician.   DISCHARGE LABORATORIES:  Hemoglobin currently is 7.5, WBC count 17.4,  hematocrit 21.9, platelets 389,000.   DISCHARGE INSTRUCTIONS:  As per the [facility] OB[facility]N handout.   DISCHARGE FOLLOWUP:  At [facility] OB[facility] in four to six weeks or as  needed.     Concha Pyo. Duplantis, C.N.M.              Osborn Coho, M.D.    SJD/MEDQ  D:  06/23/2003  T:  06/23/2003  Job:  [id number]

## 2010-11-25 NOTE — H&P (Signed)
NAME:  Tonya Calhoun, Tonya Calhoun                     ACCOUNT NO.:  [id number]   MEDICAL RECORD NO.:  [id number]                   PATIENT TYPE:  INP   LOCATION:  9157                                 [facility]ACILITY:  [facility]   PHYSICIAN:  Naima A. Dillard, M.D.              DATE OF BIRTH:  May 03, 1966   DATE OF ADMISSION:  06/01/2003  DATE OF DISCHARGE:                                HISTORY & PHYSICAL   HISTORY:  The patient is a 45 year old African-American female, gravida 4,  para 0, 0, 0, 3, 0, whose due date is September 27, 2003, consistent with  ultrasound.  The patient's estimated gestational age is 23-1/7th weeks.  The  patient went in for a routine ultrasound examination to follow up a  cerclage, and she was found to have dilation and dumbbell of the membranes  of the cervix on ultrasound today.  She had a cerclage placement in  September 2004, for incompetent cervix.  She denies any leakage of fluid,  vaginal bleeding, or contractions.  She has been on pelvic rest and denies  any intercourse.   PRE-NATAL LABORATORY DATA:  Hemoglobin 10.7, platelets 459.  She is B-  positive, antibody negative.  Sickle cell prep was negative.  RPR  nonreactive.  Hepatitis-B surface antigen negative.  HIV nonreactive.  Pap  was within normal limits.  GC and Chlamydia are both negative.   OBSTETRICAL HISTORY:  Her pregnancy was complicated in that she conceived on  Clomid, she is a gestational diabetic on February 09, 2003, requiring insulin,  a history of incompetent cervix, and she has an advanced maternal age, and  has declined an amniocentesis.   PAST OBSTETRICAL HISTORY:  1. Past OB history is significant for in 1993, she had a laparoscopic     salpingostomy on the right side.  2. In 1992, she had premature ruptured of membranes with delivery at 17     weeks.  There was no D&E required.  3. In 1996, she had premature ruptured of membranes with SAB at 17 weeks,     and she did have to have a D&E for the  removal of the placenta.   PAST GYN HISTORY:  Menarche at age 82.  They have always been irregular,  lasting for five to seven days.  Denies any history of sexually transmitted  diseases.  Did have an abnormal Pap smear in 1989, when she had a laser cone  biopsy of the cervix.  She has a history of fibroids.   PAST SURGICAL HISTORY:  Significant as above, and also for a cerclage placed  on March 23, 2003.   ALLERGIES:  PENICILLIN, with which she gets hives.   CURRENT MEDICATIONS:  1. Delalutin.  2. Prenatal vitamins.  3. NPH insulin 30 units at night, 4 units of Regular insulin before lunch.   SOCIAL HISTORY:  Negative for alcohol or illicit drug use.   FAMILY HISTORY:  The patient has  a family history of diabetes and  hypertension.  No history of GYN cancer.   PHYSICAL EXAMINATION:  VITAL SIGNS:  Blood pressure 140/79, pulse 84,  respirations 20, temperature 97.2 degrees.  GENERAL:  She is in no apparent distress.  HEART:  A regular rate and rhythm.  LUNGS:  Clear to auscultation bilaterally.  ABDOMEN:  Obese, gravid, soft, nontender, without any uterine contractions.  PELVIC:  On speculum examination the cervix appears dilated about 1.0 to 2.0  cm.  There are membranes present at the os.  There is also a white kind of  clumpy discharge, but no odor.  Scant vaginal bleeding was seen coming from  the os, but her stitch, however, is in place.  No digital examination is  done at this time.  EXTREMITIES:  No clubbing, cyanosis, or edema.  An ultrasound showed an intrauterine pregnancy and breech presentation with  normal fluid and posterior placenta.  She also had a small fibroid measuring  4.0 cm anteriorly.   ASSESSMENT:  1. Pregnancy at 23-1/7th weeks with incompetent cervix, status post a     cerclage in September 2004, with cervical dilation.  2. Gestational diabetic February 09, 2003.   PLAN:  The patient will be admitted.  GBS, gonorrhea and Chlamydia were  done.  The  sensitivities were added to the GBS.  Because the patient is  allergic to PENICILLIN, she will be started on clindamycin to help prolong  the latent phase.  The NICU will be consulted and the hematologist will also  be consulted to come and talk to the patient regarding viability at 24  weeks.  The patient will receive steroids.  We will continue her Delalutin  and prenatal vitamins.  Currently I am making her n.p.o. and covering her  with insulin sliding scale.  Once she has been stable, able to eat, and put  on her normal insulin regimen.  We talked in detail about being 23 weeks,  and viability.  The patient still wants everything done in case that the  baby may be delivered and be viable.  If she decides to have a C-section,  she understands that she may have a classical C-section.  The risks are, but  not limited to problems with anesthesia, bleeding, infection, damage to  internal organs such as the bowel and bladder, ending in a hysterectomy.  The patient has fully consented.  We will place her in Trendelenburg.  We  will give her TED stockings for deep vein thrombosis prophylaxis, and  monitor closely.                                               Naima A. Normand Sloop, M.D.    NAD/MEDQ  D:  06/01/2003  T:  06/01/2003  Job:  [id number]

## 2011-08-24 ENCOUNTER — Other Ambulatory Visit: Payer: Self-pay | Admitting: Obstetrics and Gynecology

## 2011-09-07 ENCOUNTER — Encounter (HOSPITAL_COMMUNITY): Payer: Self-pay | Admitting: Pharmacist

## 2011-09-14 ENCOUNTER — Encounter (INDEPENDENT_AMBULATORY_CARE_PROVIDER_SITE_OTHER): Payer: Managed Care, Other (non HMO) | Admitting: Obstetrics and Gynecology

## 2011-09-14 ENCOUNTER — Other Ambulatory Visit: Payer: Managed Care, Other (non HMO)

## 2011-09-14 DIAGNOSIS — D259 Leiomyoma of uterus, unspecified: Secondary | ICD-10-CM

## 2011-09-14 DIAGNOSIS — N814 Uterovaginal prolapse, unspecified: Secondary | ICD-10-CM

## 2011-09-18 ENCOUNTER — Encounter (INDEPENDENT_AMBULATORY_CARE_PROVIDER_SITE_OTHER): Payer: Managed Care, Other (non HMO) | Admitting: Obstetrics and Gynecology

## 2011-09-18 DIAGNOSIS — N816 Rectocele: Secondary | ICD-10-CM

## 2011-09-18 DIAGNOSIS — N8111 Cystocele, midline: Secondary | ICD-10-CM

## 2011-09-18 DIAGNOSIS — N814 Uterovaginal prolapse, unspecified: Secondary | ICD-10-CM

## 2011-09-19 ENCOUNTER — Encounter (HOSPITAL_COMMUNITY)
Admission: RE | Admit: 2011-09-19 | Discharge: 2011-09-19 | Disposition: A | Discharge status: Home / Self Care | Payer: Managed Care, Other (non HMO) | Source: Ambulatory Visit | Attending: Obstetrics and Gynecology | Admitting: Obstetrics and Gynecology

## 2011-09-19 ENCOUNTER — Encounter (HOSPITAL_COMMUNITY): Payer: Self-pay

## 2011-09-19 HISTORY — DX: Essential (primary) hypertension: I10

## 2011-09-19 LAB — CBC
HCT: 35.1 % — ABNORMAL LOW (ref 36.0–46.0)
Hemoglobin: 11.5 g/dL — ABNORMAL LOW (ref 12.0–15.0)
MCH: 30.1 pg (ref 26.0–34.0)
MCHC: 32.8 g/dL (ref 30.0–36.0)
MCV: 91.9 fL (ref 78.0–100.0)
Platelets: 397 10*3/uL (ref 150–400)
RBC: 3.82 MIL/uL — ABNORMAL LOW (ref 3.87–5.11)
RDW: 13.5 % (ref 11.5–15.5)
WBC: 8.4 10*3/uL (ref 4.0–10.5)

## 2011-09-19 LAB — BASIC METABOLIC PANEL WITH GFR
BUN: 12 mg/dL (ref 6–23)
CO2: 24 meq/L (ref 19–32)
Calcium: 9.6 mg/dL (ref 8.4–10.5)
Chloride: 97 meq/L (ref 96–112)
Creatinine, Ser: 0.88 mg/dL (ref 0.50–1.10)
GFR calc Af Amer: >90 mL/min (ref >90)
GFR calc non Af Amer: 79 mL/min — ABNORMAL LOW (ref >90)
Glucose, Bld: 114 mg/dL — ABNORMAL HIGH (ref 70–99)
Potassium: 3.9 meq/L (ref 3.5–5.1)
Sodium: 132 meq/L — ABNORMAL LOW (ref 135–145)

## 2011-09-19 LAB — SURGICAL PCR SCREEN
MRSA, PCR: NEGATIVE
Staphylococcus aureus: NEGATIVE

## 2011-09-19 NOTE — H&P (Addendum)
NAME:  Stormer, NYIA TSAO NO.:  [id number]  MEDICAL RECORD NO.:  [id number]  LOCATION:  [facility]                         FACILITY:  [facility]  PHYSICIAN:  Osborn Coho, M.D.   DATE OF BIRTH:  [birth year]  DATE OF ADMISSION:  08/23/2011 DATE OF DISCHARGE:                             HISTORY & PHYSICAL   HISTORY OF PRESENT ILLNESS:  Ms. Bourdeau is a 46 year old married African American female, para 0-1-4-1, presenting for a total vaginal hysterectomy with anterior-posterior colporrhaphy because of symptomatic uterine prolapse, cystocele, and rectocele.  During the patient's annual GYN exam in February, 2013, patient's uterus was noted to have prolapsed to the vaginal introitus.  Additionally, the patient has given a history of intermittent pelvic pain, particularly with intercourse, nocturia and urinary urgency.  She denies any urinary incontinence or changes in her bowel habits.  Patient's menstrual flow will last for 3-5 days, requiring her to change a pad every 2 hours.  She reports minimal cramping.  A pelvic ultrasound in March, 2013 showed a uterus measuring 7.26 x 6.20 x 5.81 cm with both ovaries appearing normal on that study. Additionally, the patient was noted to have an anterior fibroid containing calcifications measuring 2.5 x 2.7 x 2.0 cm.    A review of both medical and surgical management options for uterine prolapse were presented to the patient, however, after review of all of these options, she has decided to proceed with definitive therapy in the form of hysterectomy with anterior-posterior colporrhaphy if needed.  PAST MEDICAL HISTORY/OB HISTORY:  Gravida 5, para 0-1-4-1.  The patient delivered vaginally.  She has a history of gestational diabetes, ectopic x 2 and infertility.  GYN HISTORY:  Menarche 46 year old.  Last menstrual period August 28, 2011. Uses tubal ligation for contraception (history of IUD use).  Denies any history of sexually  transmitted diseases  other than the human papilloma virus. Has a history of laser cervical conization for abnormal Pap smear with subsequen smears returning normal.  Most recent Pap smear, February 2013.   MEDICAL HISTORY:  Polycystic ovarian syndrome, hypertension, benign left breast nodule.  SURGICAL HISTORY: 1989, conization of the cervix. 1993 salpingostomy. 1996, dilatation and curettage for retained products of conception. 2011, laparoscopic tubal cautery. 2011, breast reduction, augmentation. Denies any history of blood transfusions or problems with anesthesia.  The patient also denies any sensitivities to latex, soy, shellfish or peanuts.  FAMILY HISTORY:  Hypertension, diabetes. Lung, brain and stomach cancer.  SOCIAL HISTORY:  Patient is married.  She works for [employer] in Clinical biochemist.  She denies any alcohol, tobacco or illicit drug use.  CURRENT MEDICATIONS:  Lisinopril 20 mg daily, hydrochlorothiazide 12.5 mg daily, Kombiglyze 2.5 mg/1000 mg daily.  ALLERGY:  SHE IS ALLERGIC TO PENICILLIN AND AMPICILLIN. BOTH OF WHICH CAUSE HIVES.  REVIEW OF SYSTEMS:  The patient wears glasses.  She denies any headache with vision changes, chest pain, shortness of breath, difficulty swallowing, nasal congestion, nausea, vomiting, diarrhea, dysuria, hematuria, constipation, myalgias, arthralgias, night sweats, or skin rashes and except as is mentioned in history of present illness. Patient's review of systems is otherwise negative.  PHYSICAL EXAMINATION:  VITAL SIGNS:  Blood pressure 124/70, pulse is 82, respirations  16, temperature 98.6 degrees Fahrenheit orally, weight 238 pounds, height 5 feet 7 inches tall, body mass index 37. NECK:  Supple without masses.  There is no thyromegaly or cervical adenopathy. HEART:  Regular rate and rhythm. LUNGS: Clear. BACK: No CVA tenderness. ABDOMEN: No tenderness, guarding, rebound, palpable masses,  or organomegaly. EXTREMITIES:  No clubbing, cyanosis, or edema. PELVIC: EGBUS is normal.  Vagina is normal.  Cervix is nontender without lesions.  Uterus is prolapsed to the level of the patient's vaginal internal introitus. Uterus appears normal size, shape and consistency without tenderness, however, exam was limited by body habitus.  Adnexa without tenderness or masses.  IMPRESSION: 1. Symptomatic uterine prolapse 2. Cystocele/rectocele.  DISPOSITION:  A discussion was held with the patient regarding indications for her procedures along with their risks which include, but are not limited to reaction to anesthesia, damage to adjacent organs, infection, excessive bleeding, and the possible need  for an open abdominal incision.  The patient verbalized understanding of these risks and has consented to proceed with a total vaginal hysterectomy with possible anterior and posterior colporrhaphy, and cystoscopy at [facility] on September 26, 2011 at 10 a.m.     Katryna Tschirhart J. Lowell Guitar, P.A.-C   ______________________________ Osborn Coho, M.D.    EJP/MEDQ  D:  09/18/2011  T:  09/19/2011  Job:  [id number]  Agree with Above. H&P Reviewed. No change in H&P.

## 2011-09-19 NOTE — Patient Instructions (Signed)
YOUR PROCEDURE IS SCHEDULED ON:09/26/11  ENTER THROUGH THE MAIN ENTRANCE OF [facility] AT:0830 am  USE DESK PHONE AND DIAL [phone] TO INFORM us OF YOUR ARRIVAL  CALL [phone] IF YOU HAVE ANY QUESTIONS OR PROBLEMS PRIOR TO YOUR ARRIVAL.  REMEMBER: DO NOT EAT OR DRINK AFTER MIDNIGHT :Monday  SPECIAL INSTRUCTIONS:Hold Metformin for 24 hours prior to surgery.   YOU MAY BRUSH YOUR TEETH THE MORNING OF SURGERY   TAKE THESE MEDICINES THE DAY OF SURGERY WITH SIP OF WATER:BP meds    DO NOT WEAR JEWELRY, EYE MAKEUP, LIPSTICK OR DARK FINGERNAIL POLISH DO NOT WEAR LOTIONS  DO NOT SHAVE FOR 48 HOURS PRIOR TO SURGERY  YOU WILL NOT BE ALLOWED TO DRIVE YOURSELF HOME.  NAME OF DRIVER: Graciella Belton

## 2011-09-26 ENCOUNTER — Encounter (HOSPITAL_COMMUNITY)
Admission: RE | Disposition: A | Discharge status: Home / Self Care | Payer: Self-pay | Source: Ambulatory Visit | Attending: Obstetrics and Gynecology

## 2011-09-26 ENCOUNTER — Ambulatory Visit (HOSPITAL_COMMUNITY)
Admission: RE | Admit: 2011-09-26 | Discharge: 2011-09-27 | Disposition: A | Discharge status: Home / Self Care | Payer: Managed Care, Other (non HMO) | Source: Ambulatory Visit | Attending: Obstetrics and Gynecology | Admitting: Obstetrics and Gynecology

## 2011-09-26 ENCOUNTER — Encounter (HOSPITAL_COMMUNITY): Payer: Self-pay | Admitting: Anesthesiology

## 2011-09-26 ENCOUNTER — Ambulatory Visit (HOSPITAL_COMMUNITY): Payer: Managed Care, Other (non HMO) | Admitting: Anesthesiology

## 2011-09-26 ENCOUNTER — Encounter (HOSPITAL_COMMUNITY): Payer: Self-pay | Admitting: *Deleted

## 2011-09-26 DIAGNOSIS — I10 Essential (primary) hypertension: Secondary | ICD-10-CM | POA: Insufficient documentation

## 2011-09-26 DIAGNOSIS — N812 Incomplete uterovaginal prolapse: Secondary | ICD-10-CM | POA: Insufficient documentation

## 2011-09-26 DIAGNOSIS — D251 Intramural leiomyoma of uterus: Secondary | ICD-10-CM | POA: Insufficient documentation

## 2011-09-26 DIAGNOSIS — N949 Unspecified condition associated with female genital organs and menstrual cycle: Secondary | ICD-10-CM | POA: Insufficient documentation

## 2011-09-26 DIAGNOSIS — N8189 Other female genital prolapse: Secondary | ICD-10-CM

## 2011-09-26 HISTORY — PX: VAGINAL HYSTERECTOMY: SHX2639

## 2011-09-26 HISTORY — PX: CYSTOSCOPY: SHX5120

## 2011-09-26 HISTORY — PX: ANTERIOR AND POSTERIOR REPAIR: SHX5121

## 2011-09-26 LAB — HCG, SERUM, QUALITATIVE: Preg, Serum: NEGATIVE

## 2011-09-26 LAB — GLUCOSE, CAPILLARY
Glucose-Capillary: 124 mg/dL — ABNORMAL HIGH (ref 70–99)
Glucose-Capillary: 178 mg/dL — ABNORMAL HIGH (ref 70–99)

## 2011-09-26 SURGERY — HYSTERECTOMY, VAGINAL
Anesthesia: General | Site: Uterus | Wound class: Clean Contaminated

## 2011-09-26 MED ORDER — HYDROMORPHONE 0.3 MG/ML IV SOLN
INTRAVENOUS | Status: AC
  Filled 2011-09-26: qty 25

## 2011-09-26 MED ORDER — ESTRADIOL 0.1 MG/GM VA CREA
TOPICAL_CREAM | VAGINAL | Status: DC | PRN
  Administered 2011-09-26: 1 via VAGINAL

## 2011-09-26 MED ORDER — 0.9 % SODIUM CHLORIDE (POUR BTL) OPTIME
TOPICAL | Status: DC | PRN
  Administered 2011-09-26: 1000 mL

## 2011-09-26 MED ORDER — LISINOPRIL 20 MG PO TABS
20.0000 mg | ORAL_TABLET | Freq: Every day | ORAL | Status: DC
  Administered 2011-09-27: 20 mg via ORAL
  Filled 2011-09-26 (×3): qty 1

## 2011-09-26 MED ORDER — METFORMIN HCL ER 500 MG PO TB24
1000.0000 mg | ORAL_TABLET | Freq: Every day | ORAL | Status: DC
Start: 2011-09-27 — End: 2011-09-27
  Administered 2011-09-27: 1000 mg via ORAL
  Filled 2011-09-26 (×2): qty 2

## 2011-09-26 MED ORDER — GLYCOPYRROLATE 0.2 MG/ML IJ SOLN
INTRAMUSCULAR | Status: DC | PRN
  Administered 2011-09-26: 0.3 mg via INTRAVENOUS
  Administered 2011-09-26: 0.2 mg via INTRAVENOUS

## 2011-09-26 MED ORDER — MIDAZOLAM HCL 5 MG/5ML IJ SOLN
INTRAMUSCULAR | Status: DC | PRN
  Administered 2011-09-26: 2 mg via INTRAVENOUS

## 2011-09-26 MED ORDER — MEPERIDINE HCL 25 MG/ML IJ SOLN: 6.2500 mg | INTRAMUSCULAR | Status: DC | PRN

## 2011-09-26 MED ORDER — KETOROLAC TROMETHAMINE 30 MG/ML IJ SOLN
15.0000 mg | Freq: Once | INTRAMUSCULAR | Status: AC | PRN
  Administered 2011-09-26: 30 mg via INTRAVENOUS

## 2011-09-26 MED ORDER — SODIUM CHLORIDE 0.9 % IJ SOLN: 9.0000 mL | INTRAMUSCULAR | Status: DC | PRN

## 2011-09-26 MED ORDER — LACTATED RINGERS IV SOLN
INTRAVENOUS | Status: DC
  Administered 2011-09-26 (×5): via INTRAVENOUS

## 2011-09-26 MED ORDER — PROMETHAZINE HCL 25 MG/ML IJ SOLN: 6.2500 mg | INTRAMUSCULAR | Status: DC | PRN

## 2011-09-26 MED ORDER — SAXAGLIPTIN-METFORMIN ER 2.5-1000 MG PO TB24: 1.0000 | ORAL_TABLET | Freq: Every day | ORAL | Status: DC

## 2011-09-26 MED ORDER — VASOPRESSIN 20 UNIT/ML IJ SOLN
INTRAVENOUS | Status: DC | PRN
  Administered 2011-09-26: 11:00:00 via INTRAMUSCULAR

## 2011-09-26 MED ORDER — GENTAMICIN IN SALINE 1-0.9 MG/ML-% IV SOLN
100.0000 mg | INTRAVENOUS | Status: AC
  Administered 2011-09-26: 100 mg via INTRAVENOUS
  Filled 2011-09-26: qty 100

## 2011-09-26 MED ORDER — KETOROLAC TROMETHAMINE 30 MG/ML IJ SOLN
30.0000 mg | Freq: Four times a day (QID) | INTRAMUSCULAR | Status: DC
  Administered 2011-09-26 – 2011-09-27 (×2): 30 mg via INTRAVENOUS
  Filled 2011-09-26 (×3): qty 1

## 2011-09-26 MED ORDER — ONDANSETRON HCL 4 MG/2ML IJ SOLN
INTRAMUSCULAR | Status: DC | PRN
  Administered 2011-09-26: 4 mg via INTRAVENOUS

## 2011-09-26 MED ORDER — DEXTROSE 5 % IV SOLN: 2.0000 g | INTRAVENOUS | Status: DC

## 2011-09-26 MED ORDER — LACTATED RINGERS IV SOLN
INTRAVENOUS | Status: DC
  Administered 2011-09-26: 23:00:00 via INTRAVENOUS
  Administered 2011-09-26: 125 mL/h via INTRAVENOUS
  Administered 2011-09-27: 08:00:00 via INTRAVENOUS

## 2011-09-26 MED ORDER — ROCURONIUM BROMIDE 100 MG/10ML IV SOLN
INTRAVENOUS | Status: DC | PRN
  Administered 2011-09-26: 45 mg via INTRAVENOUS
  Administered 2011-09-26: 5 mg via INTRAVENOUS

## 2011-09-26 MED ORDER — STERILE WATER FOR IRRIGATION IR SOLN
Status: DC | PRN
  Administered 2011-09-26: 1000 mL

## 2011-09-26 MED ORDER — IBUPROFEN 600 MG PO TABS: 600.0000 mg | ORAL_TABLET | Freq: Four times a day (QID) | ORAL | Status: DC | PRN

## 2011-09-26 MED ORDER — NEOSTIGMINE METHYLSULFATE 1 MG/ML IJ SOLN
INTRAMUSCULAR | Status: DC | PRN
  Administered 2011-09-26: 2 mg via INTRAVENOUS

## 2011-09-26 MED ORDER — KETOROLAC TROMETHAMINE 30 MG/ML IJ SOLN
INTRAMUSCULAR | Status: AC
  Administered 2011-09-26: 30 mg via INTRAVENOUS
  Filled 2011-09-26: qty 1

## 2011-09-26 MED ORDER — HYDROMORPHONE HCL PF 1 MG/ML IJ SOLN: 0.2500 mg | INTRAMUSCULAR | Status: DC | PRN

## 2011-09-26 MED ORDER — LINAGLIPTIN 5 MG PO TABS
5.0000 mg | ORAL_TABLET | Freq: Every day | ORAL | Status: DC
  Administered 2011-09-27: 5 mg via ORAL
  Filled 2011-09-26 (×2): qty 1

## 2011-09-26 MED ORDER — NALOXONE HCL 0.4 MG/ML IJ SOLN: 0.4000 mg | INTRAMUSCULAR | Status: DC | PRN

## 2011-09-26 MED ORDER — OXYCODONE-ACETAMINOPHEN 5-325 MG PO TABS
1.0000 | ORAL_TABLET | ORAL | Status: DC | PRN
  Administered 2011-09-27 (×2): 2 via ORAL
  Filled 2011-09-26 (×2): qty 2

## 2011-09-26 MED ORDER — HYDROCHLOROTHIAZIDE 12.5 MG PO CAPS
12.5000 mg | ORAL_CAPSULE | Freq: Every day | ORAL | Status: DC
  Administered 2011-09-27: 12.5 mg via ORAL
  Filled 2011-09-26 (×2): qty 1

## 2011-09-26 MED ORDER — DIPHENHYDRAMINE HCL 12.5 MG/5ML PO ELIX: 12.5000 mg | ORAL_SOLUTION | Freq: Four times a day (QID) | ORAL | Status: DC | PRN

## 2011-09-26 MED ORDER — HYDROMORPHONE HCL PF 1 MG/ML IJ SOLN
INTRAMUSCULAR | Status: DC | PRN
  Administered 2011-09-26 (×3): 1 mg via INTRAVENOUS

## 2011-09-26 MED ORDER — LIDOCAINE HCL (CARDIAC) 20 MG/ML IV SOLN
INTRAVENOUS | Status: DC | PRN
  Administered 2011-09-26: 60 mg via INTRAVENOUS

## 2011-09-26 MED ORDER — MENTHOL 3 MG MT LOZG: 1.0000 | LOZENGE | OROMUCOSAL | Status: DC | PRN

## 2011-09-26 MED ORDER — DIPHENHYDRAMINE HCL 50 MG/ML IJ SOLN: 12.5000 mg | Freq: Four times a day (QID) | INTRAMUSCULAR | Status: DC | PRN

## 2011-09-26 MED ORDER — ONDANSETRON HCL 4 MG/2ML IJ SOLN: 4.0000 mg | Freq: Four times a day (QID) | INTRAMUSCULAR | Status: DC | PRN

## 2011-09-26 MED ORDER — FENTANYL CITRATE 0.05 MG/ML IJ SOLN
INTRAMUSCULAR | Status: DC | PRN
  Administered 2011-09-26: 100 ug via INTRAVENOUS
  Administered 2011-09-26: 50 ug via INTRAVENOUS
  Administered 2011-09-26 (×2): 100 ug via INTRAVENOUS
  Administered 2011-09-26: 50 ug via INTRAVENOUS
  Administered 2011-09-26 (×2): 100 ug via INTRAVENOUS

## 2011-09-26 MED ORDER — HYDROMORPHONE 0.3 MG/ML IV SOLN
INTRAVENOUS | Status: DC
  Administered 2011-09-26: 1.39 mg via INTRAVENOUS
  Administered 2011-09-26: 16:00:00 via INTRAVENOUS
  Administered 2011-09-26: 2 mg via INTRAVENOUS
  Administered 2011-09-27: 0.6 mg via INTRAVENOUS
  Administered 2011-09-27: 0.2 mg via INTRAVENOUS
  Administered 2011-09-27: 0.199 mg via INTRAVENOUS

## 2011-09-26 MED ORDER — INDIGOTINDISULFONATE SODIUM 8 MG/ML IJ SOLN
INTRAMUSCULAR | Status: DC | PRN
  Administered 2011-09-26: 5 mL via INTRAVENOUS

## 2011-09-26 MED ORDER — CLINDAMYCIN PHOSPHATE 900 MG/50ML IV SOLN
900.0000 mg | Freq: Once | INTRAVENOUS | Status: AC
  Administered 2011-09-26: 900 mg via INTRAVENOUS
  Filled 2011-09-26: qty 50

## 2011-09-26 MED ORDER — PROPOFOL 10 MG/ML IV EMUL
INTRAVENOUS | Status: DC | PRN
  Administered 2011-09-26: 200 mg via INTRAVENOUS

## 2011-09-26 SURGICAL SUPPLY — 43 items
CANISTER SUCTION 2500CC (MISCELLANEOUS) ×4 IMPLANT
CLOTH BEACON ORANGE TIMEOUT ST (SAFETY) ×4 IMPLANT
CONT PATH 16OZ SNAP LID 3702 (MISCELLANEOUS) IMPLANT
DECANTER SPIKE VIAL GLASS SM (MISCELLANEOUS) IMPLANT
DRAPE CAMERA CLOSED 9X96 (DRAPES) ×4 IMPLANT
DRAPE HYSTEROSCOPY (DRAPE) IMPLANT
DRAPE PROXIMA HALF (DRAPES) ×4 IMPLANT
DRAPE STERI URO 9X17 APER PCH (DRAPES) ×4 IMPLANT
GAUZE SPONGE 4X4 16PLY XRAY LF (GAUZE/BANDAGES/DRESSINGS) ×4 IMPLANT
GLOVE BIO SURGEON STRL SZ 6.5 (GLOVE) ×8 IMPLANT
GLOVE BIO SURGEON STRL SZ7.5 (GLOVE) ×8 IMPLANT
GLOVE BIOGEL PI IND STRL 6.5 (GLOVE) ×3 IMPLANT
GLOVE BIOGEL PI IND STRL 7.5 (GLOVE) ×6 IMPLANT
GLOVE BIOGEL PI INDICATOR 6.5 (GLOVE) ×1
GLOVE BIOGEL PI INDICATOR 7.5 (GLOVE) ×2
GOWN PREVENTION PLUS LG XLONG (DISPOSABLE) ×12 IMPLANT
GOWN STRL REIN XL XLG (GOWN DISPOSABLE) ×4 IMPLANT
NEEDLE HYPO 22GX1.5 SAFETY (NEEDLE) IMPLANT
NEEDLE MAYO .5 CIRCLE (NEEDLE) ×4 IMPLANT
NEEDLE SPNL 22GX3.5 QUINCKE BK (NEEDLE) IMPLANT
NS IRRIG 1000ML POUR BTL (IV SOLUTION) ×4 IMPLANT
PACK VAGINAL WOMENS (CUSTOM PROCEDURE TRAY) ×4 IMPLANT
SET CYSTO W/LG BORE CLAMP LF (SET/KITS/TRAYS/PACK) ×4 IMPLANT
SUT CHROMIC 0 CT 1 (SUTURE) IMPLANT
SUT CHROMIC 3 0 SH 27 (SUTURE) IMPLANT
SUT VIC AB 0 CT1 18XCR BRD8 (SUTURE) ×9 IMPLANT
SUT VIC AB 0 CT1 27 (SUTURE) ×8
SUT VIC AB 0 CT1 27XBRD ANBCTR (SUTURE) ×6 IMPLANT
SUT VIC AB 0 CT1 36 (SUTURE) ×4 IMPLANT
SUT VIC AB 0 CT1 8-18 (SUTURE) ×12
SUT VIC AB 1 CT1 36 (SUTURE) IMPLANT
SUT VIC AB 2-0 CT1 (SUTURE) ×16 IMPLANT
SUT VIC AB 2-0 CT1 27 (SUTURE) ×4
SUT VIC AB 2-0 CT1 TAPERPNT 27 (SUTURE) ×3 IMPLANT
SUT VIC AB 2-0 SH 27 (SUTURE) ×12
SUT VIC AB 2-0 SH 27XBRD (SUTURE) ×9 IMPLANT
SUT VIC AB 3-0 SH 27 (SUTURE) ×21
SUT VIC AB 3-0 SH 27X BRD (SUTURE) ×21 IMPLANT
SUT VICRYL 0 TIES 12 18 (SUTURE) ×4 IMPLANT
SYR TB 1ML 25GX5/8 (SYRINGE) ×4 IMPLANT
TOWEL OR 17X24 6PK STRL BLUE (TOWEL DISPOSABLE) ×8 IMPLANT
TRAY FOLEY CATH 14FR (SET/KITS/TRAYS/PACK) ×4 IMPLANT
WATER STERILE IRR 1000ML POUR (IV SOLUTION) ×4 IMPLANT

## 2011-09-26 NOTE — Anesthesia Procedure Notes (Signed)
Procedure Name: Intubation Date/Time: 09/26/2011 10:30 AM Performed by: Graciela Husbands Pre-anesthesia Checklist: Suction available, Emergency Drugs available, Timeout performed, Patient identified and Patient being monitored Patient Re-evaluated:Patient Re-evaluated prior to inductionOxygen Delivery Method: Circle system utilized Preoxygenation: Pre-oxygenation with 100% oxygen Intubation Type: IV induction Ventilation: Mask ventilation without difficulty and Oral airway inserted - appropriate to patient size Laryngoscope Size: Mac and 3 Grade View: Grade I Tube size: 7.0 mm Number of attempts: 1 Airway Equipment and Method: Stylet Placement Confirmation: ETT inserted through vocal cords under direct vision,  positive ETCO2 and breath sounds checked- equal and bilateral Secured at: 21 cm Tube secured with: Tape Dental Injury: Teeth and Oropharynx as per pre-operative assessment

## 2011-09-26 NOTE — Op Note (Signed)
Preop Diagnosis: UTERINE PROLAPSE, CYSTOCELE, RECTOCELE   Postop Diagnosis: UTERINE PROLAPSE, CYSTOCELE, RECTOCELE   Procedure: HYSTERECTOMY VAGINAL ANTERIOR (CYSTOCELE) AND POSTERIOR REPAIR (RECTOCELE) CYSTOSCOPY   Anesthesia: General   Anesthesiologist: Creta Levin, MD  Attending: Purcell Nails, MD   Assistant: E. Lowell Guitar, [facility]  Findings: Complete prolapse, small cystocele and rectocele  Pathology: Uterus and cervix weighing 134g  Fluids: 4500cc  UOP: 200cc  EBL: 500cc  Complications: None  Procedure: The patient was taken to the operating room after the risks, benefits and alternatives were discussed with the patient. The patient verbalized understanding and consent signed and witnessed. The patient was placed under general anesthesia per the anesthesiologist and a timeout was performed per protocol. The patient was prepped and draped in the normal sterile fashion in the dorsal lithotomy position.  A weighted speculum was placed the patient's vagina and the anterior lip of the cervix was grasped with a single-tooth tenaculum and Dever retractors were placed for vaginal wall retraction. The cervix was circumscribed with the bovie after injecting the cervix with pitressin at a concentration of 20 units of Pitressin in 50 cc of normal saline.  Once the cervix was circumscribed the anterior cul-de-sac was entered without difficulty.  The posterior cul-de-sac was entered without difficulty as well.  Curved Heaney clamps were used to clamp the uterosacral and cardinal ligaments and the tissue was then cut and suture ligated using 0 Vicryl. This was done bilaterally and sequentially. The remaining parametrial tissue was clamped, cut and suture ligated using 0 Vicryl in a sequential and bilateral fashion as well.  The uterine fundus was exteriorized and the remaining pedicles were bilaterally clamped, cut and suture ligated with 0 vicryl.  The uterus and cervix were handed off to be  sent to pathology. The bilateral ovaries and fallopian tubes appeared to be within normal limits. The angles of the cuff were sutured using 0 vicryl.  The cuff was then repaired to the midline with figure-of-eight and interrupted stitches of 0 Vicryl.  The cuff was noted to be hemostatic.    The cystocele was identified and the anterior vaginal wall was injected with dilute Pitressin.  The anterior vaginal wall was then incised and the underlying tissue dissected away.  Excess tissue was excised and the anterior vaginal wall was repaired with a purse string stitch of 3-0 vicryl.  The anterior vaginal wall was then repaired using 2-0 Vicryl via interrupted stitches. The Foley was removed and cystoscopy was performed after administration of indigo carmine. Bilateral ureters were noted to eflux without difficulty and there were no inadvertent bladder injuries. The Foley was replaced to gravity.  Attention was then turned to the posterior vaginal wall where dilute Pitressin was injected. The posterior vaginal wall was incised and the underlying tissue was dissected away from the posterior vaginal wall. The rectocele was repaired using plication stitches of 2-0 Vicryl.  Excess tissue was excised and the posterior vaginal wall was repaired with 2-0 Vicryl via a running interlocking stitch after reinforcing the perineal body.  The perineum was repaired with 3-0 Vicryl via subcutaneous stitch.   The patient tolerated the procedure well and was returned to the recovery room in good condition.

## 2011-09-26 NOTE — Transfer of Care (Signed)
Immediate Anesthesia Transfer of Care Note  Patient: Tonya Calhoun  Procedure(s) Performed: Procedure(s) (LRB): HYSTERECTOMY VAGINAL (N/A) ANTERIOR (CYSTOCELE) AND POSTERIOR REPAIR (RECTOCELE) (N/A) CYSTOSCOPY (Bilateral)  Patient Location: PACU  Anesthesia Type: General  Level of Consciousness: awake, alert  and oriented  Airway & Oxygen Therapy: Patient Spontanous Breathing and Patient connected to nasal cannula oxygen  Post-op Assessment: Report given to PACU RN and Post -op Vital signs reviewed and stable  Post vital signs: Reviewed and stable  Complications: No apparent anesthesia complications

## 2011-09-26 NOTE — Progress Notes (Signed)
Day of Surgery Procedure(s) (LRB): HYSTERECTOMY VAGINAL (N/A) ANTERIOR (CYSTOCELE) AND POSTERIOR REPAIR (RECTOCELE) (N/A) CYSTOSCOPY (Bilateral)  Subjective: Patient reports nausea earlier.  No other complaints.  Pain is well controlled.  Objective: I have reviewed patient's vital signs and intake and output. UOP 300cc/4 1/2hrs  General: alert and no distress Resp: clear to auscultation bilaterally Cardio: regular rate and rhythm GI: soft, non-tender; decreased bowel sounds normal; no masses,  no organomegaly Extremities: extremities normal, atraumatic, no cyanosis or edema and Homans sign is negative, no sign of DVT Vaginal Bleeding: minimal, vaginal packing in place.  Assessment: s/p Procedure(s) (LRB): HYSTERECTOMY VAGINAL (N/A) ANTERIOR (CYSTOCELE) AND POSTERIOR REPAIR (RECTOCELE) (N/A) CYSTOSCOPY (Bilateral): stable  Plan: Advance diet Encourage ambulation SCDs for DVT prophylaxis CBC in am Continue routine post op care  LOS: 0 days    Tonya Calhoun Y 09/26/2011, 9:08 PM

## 2011-09-26 NOTE — Anesthesia Postprocedure Evaluation (Signed)
  Anesthesia Post-op Note  Patient: Tonya Calhoun  Procedure(s) Performed: Procedure(s) (LRB): HYSTERECTOMY VAGINAL (N/A) ANTERIOR (CYSTOCELE) AND POSTERIOR REPAIR (RECTOCELE) (N/A) CYSTOSCOPY (Bilateral)  Patient Location: PACU  Anesthesia Type: General  Level of Consciousness: awake, oriented and sedated  Airway and Oxygen Therapy: Patient Spontanous Breathing and Patient connected to nasal cannula oxygen  Post-op Pain: none  Post-op Assessment: Post-op Vital signs reviewed, Patient's Cardiovascular Status Stable, Respiratory Function Stable, Patent Airway, No signs of Nausea or vomiting and Pain level controlled  Post-op Vital Signs: Reviewed and stable  Complications: No apparent anesthesia complications

## 2011-09-26 NOTE — Anesthesia Preprocedure Evaluation (Signed)
Anesthesia Evaluation  Patient identified by MRN, date of birth, ID band Patient awake    Reviewed: Allergy & Precautions, H&P , NPO status , Patient's Chart, lab work & pertinent test results  Airway Mallampati: II      Dental No notable dental hx.    Pulmonary neg pulmonary ROS,    Pulmonary exam normal       Cardiovascular hypertension, Pt. on medications     Neuro/Psych negative neurological ROS  negative psych ROS   GI/Hepatic negative GI ROS, Neg liver ROS,   Endo/Other  Diabetes mellitus-, Type 2, Oral Hypoglycemic AgentsMorbid obesity  Renal/GU negative Renal ROS  negative genitourinary   Musculoskeletal negative musculoskeletal ROS (+)   Abdominal Normal abdominal exam  (+)   Peds negative pediatric ROS (+)  Hematology negative hematology ROS (+)   Anesthesia Other Findings   Reproductive/Obstetrics negative OB ROS                           Anesthesia Physical Anesthesia Plan  ASA: III  Anesthesia Plan: General   Post-op Pain Management:    Induction: Intravenous  Airway Management Planned: Oral ETT  Additional Equipment:   Intra-op Plan:   Post-operative Plan: Extubation in OR  Informed Consent: I have reviewed the patients History and Physical, chart, labs and discussed the procedure including the risks, benefits and alternatives for the proposed anesthesia with the patient or authorized representative who has indicated his/her understanding and acceptance.   Dental advisory given  Plan Discussed with: CRNA and Surgeon  Anesthesia Plan Comments:         Anesthesia Quick Evaluation

## 2011-09-27 LAB — CBC
MCH: 29.9 pg (ref 26.0–34.0)
MCHC: 32.4 g/dL (ref 30.0–36.0)
Platelets: 318 10*3/uL (ref 150–400)
RDW: 13 % (ref 11.5–15.5)

## 2011-09-27 MED ORDER — ESTROGENS, CONJUGATED 0.625 MG/GM VA CREA: TOPICAL_CREAM | VAGINAL | Status: AC

## 2011-09-27 MED ORDER — OXYCODONE-ACETAMINOPHEN 5-325 MG PO TABS: 1.0000 | ORAL_TABLET | Freq: Four times a day (QID) | ORAL | Status: AC | PRN

## 2011-09-27 MED ORDER — IBUPROFEN 600 MG PO TABS: 600.0000 mg | ORAL_TABLET | Freq: Four times a day (QID) | ORAL | Status: AC | PRN

## 2011-09-27 NOTE — Anesthesia Postprocedure Evaluation (Signed)
  Anesthesia Post-op Note  Patient: Tonya Calhoun  Procedure(s) Performed: Procedure(s) (LRB): HYSTERECTOMY VAGINAL (N/A) ANTERIOR (CYSTOCELE) AND POSTERIOR REPAIR (RECTOCELE) (N/A) CYSTOSCOPY (Bilateral)  Patient Location: [facility]  Anesthesia Type: General  Level of Consciousness: awake, alert  and oriented  Airway and Oxygen Therapy: Patient Spontanous Breathing  Post-op Pain: none  Post-op Assessment: Post-op Vital signs reviewed and Patient's Cardiovascular Status Stable  Post-op Vital Signs: Reviewed and stable  Complications: No apparent anesthesia complications

## 2011-09-27 NOTE — Discharge Summary (Signed)
Physician Discharge Summary  Patient ID: Tonya Calhoun MRN: [id number] DOB/AGE: 11-05-1965 46 y.o.  Admit date: 09/26/2011 Discharge date: 09/27/2011  Admission Diagnoses: Uterine Prolapse, Symptomatic Cystocele and Rectocele  Discharge Diagnoses:  Doing well s/p Surgery  Active Problems:  * No active hospital problems. *    Discharged Condition: good  Hospital Course: s/p TVH/Ant and Post Repair.  Recovered well.  Appropriate anemia.  Consults: None  Significant Diagnostic Studies: labs: Hgb 8.4  Treatments: surgery: as above  Discharge Exam: Blood pressure 94/62, pulse 103, temperature 98.1 F (36.7 C), temperature source Oral, resp. rate 18, height 5\' 7"  (1.702 m), weight 104.327 kg (230 lb), SpO2 95.00%. See physical exam on progress note  Disposition:   Discharge Orders    Future Appointments: Provider: Department: Dept Phone: Center:   11/06/2011 10:30 AM Purcell Nails, MD [facility] [phone] None     Medication List  As of 09/27/2011  5:09 PM   ASK your doctor about these medications         hydrochlorothiazide 25 MG tablet   Commonly known as: HYDRODIURIL   Take 12.5 mg by mouth daily.      KOMBIGLYZE XR 2.11-998 MG Tb24   Generic drug: Saxagliptin-Metformin   Take 1 tablet by mouth daily.      lisinopril 20 MG tablet   Commonly known as: PRINIVIL,ZESTRIL   Take 20 mg by mouth daily.             Premarin Cream          Ibuprofen          Percocet       Follow-up Information    Follow up with Purcell Nails, MD in 6 weeks.   Contact information:   [address] [place] [address] [phone]          Signed: Purcell Nails 09/27/2011, 5:09 PM

## 2011-09-27 NOTE — Progress Notes (Signed)
Pt is discharged in the care of Mother. downstair per ambulatory. Denies any excessive bleeding or pain stable.Spirits are e good . Comphrended all instructions well.

## 2011-09-27 NOTE — Progress Notes (Signed)
Tonya Calhoun is a55 y.o.  [id number]  Post Op Date # 1  Subjective: Patient is Doing well postoperatively. Patient has Pain is controlled with current analgesics. Medications being used: prescription NSAID's including Toradol IV and narcotic analgesics including hydromorphone (Dilaudid)., Denies nausea, dizziness or other complaints. Has passed flatus. Diet Liquids but pt. reports being ready for regular diet. Patient has not voided yet, foley just removed Activity: Other: Standing in room. Hasn't ambulated yet.  Objective: Vital signs in last 24 hours: Temp:  [97.4 F (36.3 C)-98.4 F (36.9 C)] 98.2 F (36.8 C) (03/20 0627) Pulse Rate:  [71-108] 86  (03/20 0627) Resp:  [14-22] 18  (03/20 0627) BP: (104-132)/(46-91) 130/76 mmHg (03/20 0627) SpO2:  [92 %-100 %] 98 % (03/20 0627) Weight:  [104.327 kg (230 lb)] 104.327 kg (230 lb) (03/19 2324)  Intake/Output from previous day: 03/19 0701 - 03/20 0700 In: 4098.1 [P.O.:740; I.V.:5948.8] Out: 2425 [Urine:1725] Intake/Output this shift: Total I/O In: 1448.8 [P.O.:500; I.V.:948.8] Out: 1100 [Urine:1100]  Lab 09/27/11 0545  WBC 14.3*  HGB 8.4*  HCT 25.9*  PLT 318    No results found for this basename: NA:3,K:3,CL:3,CO2:3,BUN:3,CREATININE:3,CALCIUM:3,LABALBU:3,PROT:3,BILITOT:3,ALKPHOS:3,ALT:3,AST:3,GLUCOSE:3 in the last 168 hours  EXAM: Resp: clear to auscultation bilaterally Cardio: regular rate and rhythm GI: BS present, soft, appropriately tender Extremities: Homans sign is negative, no sign of DVT and SCD hose in place & functioning Vaginal packing removed with dry "mauve"colored stain throughout.   Assessment: s/p Procedure(s): HYSTERECTOMY VAGINAL ANTERIOR (CYSTOCELE) AND POSTERIOR REPAIR (RECTOCELE) CYSTOSCOPY: stable, progressing well and anemia  Plan: Advance diet Encourage ambulation Advance to PO medication Discontinue IV fluids Per Dr. Su Hilt  LOS: 1 day    [name],[name],  PA-C  09/27/2011 6:56  AM   Agree with above.  Pt doing well.  + flatus, tolerating po, ambulating without difficulty and asking about going home.  D/c instructions discussed.  F/u in 6wks.

## 2011-09-28 ENCOUNTER — Encounter (HOSPITAL_COMMUNITY): Payer: Self-pay | Admitting: Obstetrics and Gynecology

## 2011-11-06 ENCOUNTER — Ambulatory Visit (INDEPENDENT_AMBULATORY_CARE_PROVIDER_SITE_OTHER): Payer: Managed Care, Other (non HMO) | Admitting: Obstetrics and Gynecology

## 2011-11-06 ENCOUNTER — Encounter: Payer: Self-pay | Admitting: Obstetrics and Gynecology

## 2011-11-06 VITALS — BP 118/76 | Temp 98.3°F | Resp 14 | Ht 67.0 in | Wt 234.0 lb

## 2011-11-06 DIAGNOSIS — Z09 Encounter for follow-up examination after completed treatment for conditions other than malignant neoplasm: Secondary | ICD-10-CM

## 2011-11-06 DIAGNOSIS — Z90710 Acquired absence of both cervix and uterus: Secondary | ICD-10-CM

## 2011-11-06 NOTE — Progress Notes (Signed)
DATE OR SURGERY: 09/26/2011 PAIN:No VAG BLEEDING: no VAG DISCHARGE: no NORMAL GI FUNCTN: yes NORMAL GU FUNCTN: yes  No complaints.  Had mammo last May.  Pelvic exam: VULVA: normal appearing vulva with no masses, tenderness or lesions, VAGINA: normal appearing vagina with normal color and discharge, no lesions, ADNEXA: normal adnexa in size, nontender and no masses. Stitches still in place. Path benign fibroids  A/P Doing Well s/p TVH/A-P Repair AEX 78yr Mammo in May Returning to work tomorrow. May resume all normal activities.

## 2016-08-03 DIAGNOSIS — I10 Essential (primary) hypertension: Secondary | ICD-10-CM | POA: Diagnosis not present

## 2016-08-03 DIAGNOSIS — E119 Type 2 diabetes mellitus without complications: Secondary | ICD-10-CM | POA: Diagnosis not present

## 2016-08-04 DIAGNOSIS — E119 Type 2 diabetes mellitus without complications: Secondary | ICD-10-CM | POA: Diagnosis not present

## 2016-08-29 DIAGNOSIS — J018 Other acute sinusitis: Secondary | ICD-10-CM | POA: Diagnosis not present

## 2016-08-29 DIAGNOSIS — R05 Cough: Secondary | ICD-10-CM | POA: Diagnosis not present

## 2016-12-21 DIAGNOSIS — E119 Type 2 diabetes mellitus without complications: Secondary | ICD-10-CM | POA: Diagnosis not present

## 2017-04-02 ENCOUNTER — Ambulatory Visit (INDEPENDENT_AMBULATORY_CARE_PROVIDER_SITE_OTHER): Payer: 59 | Admitting: Podiatry

## 2017-04-02 DIAGNOSIS — B351 Tinea unguium: Secondary | ICD-10-CM | POA: Diagnosis not present

## 2017-04-02 DIAGNOSIS — E0842 Diabetes mellitus due to underlying condition with diabetic polyneuropathy: Secondary | ICD-10-CM | POA: Diagnosis not present

## 2017-04-02 DIAGNOSIS — M79676 Pain in unspecified toe(s): Secondary | ICD-10-CM

## 2017-04-02 NOTE — Progress Notes (Signed)
   Subjective:    Patient ID: Tonya Calhoun, female    DOB: 03-28-1966, 51 y.o.   MRN: [id number]  HPI    Review of Systems  All other systems reviewed and are negative.      Objective:   Physical Exam        Assessment & Plan:

## 2017-04-02 NOTE — Progress Notes (Signed)
Patient ID: Tonya Calhoun, female   DOB: 03/21/1966, 51 y.o.   MRN: [id number]   SUBJECTIVE Patient with a history of diabetes mellitus presents to office today complaining of elongated, thickened nails. Pain while ambulating in shoes. Patient is unable to trim their own nails.   Past Medical History:  Diagnosis Date  . Diabetes mellitus   . Hypertension     OBJECTIVE General Patient is awake, alert, and oriented x 3 and in no acute distress. Derm Skin is dry and supple bilateral. Negative open lesions or macerations. Remaining integument unremarkable. Nails are tender, long, thickened and dystrophic with subungual debris, consistent with onychomycosis, 1-5 bilateral. No signs of infection noted. Vasc  DP and PT pedal pulses palpable bilaterally. Temperature gradient within normal limits.  Neuro Epicritic and protective threshold sensation diminished bilaterally.  Musculoskeletal Exam No symptomatic pedal deformities noted bilateral. Muscular strength within normal limits.  ASSESSMENT 1. Diabetes Mellitus w/ peripheral neuropathy 2. Onychomycosis of nail due to dermatophyte bilateral 3. Pain in foot bilateral  PLAN OF CARE 1. Patient evaluated today. 2. Instructed to maintain good pedal hygiene and foot care. Stressed importance of controlling blood sugar.  3. Mechanical debridement of nails 1-5 bilaterally performed using a nail nipper. Filed with dremel without incident.  4. Return to clinic in 3 mos.     Edrick Kins, DPM [facility]  Dr. Edrick Kins, [address]                                        [place], [place] [address]                Office [phone]  Fax [phone]

## 2017-04-10 DIAGNOSIS — D649 Anemia, unspecified: Secondary | ICD-10-CM | POA: Diagnosis not present

## 2017-04-10 DIAGNOSIS — Z1231 Encounter for screening mammogram for malignant neoplasm of breast: Secondary | ICD-10-CM | POA: Diagnosis not present

## 2017-04-10 DIAGNOSIS — Z01419 Encounter for gynecological examination (general) (routine) without abnormal findings: Secondary | ICD-10-CM | POA: Diagnosis not present

## 2017-06-02 DIAGNOSIS — Z0100 Encounter for examination of eyes and vision without abnormal findings: Secondary | ICD-10-CM | POA: Diagnosis not present

## 2017-06-29 ENCOUNTER — Ambulatory Visit: Payer: 59 | Admitting: Podiatry

## 2017-07-26 DIAGNOSIS — E119 Type 2 diabetes mellitus without complications: Secondary | ICD-10-CM | POA: Diagnosis not present

## 2017-07-26 DIAGNOSIS — I10 Essential (primary) hypertension: Secondary | ICD-10-CM | POA: Diagnosis not present

## 2017-09-19 DIAGNOSIS — J019 Acute sinusitis, unspecified: Secondary | ICD-10-CM | POA: Diagnosis not present

## 2017-09-19 DIAGNOSIS — H6983 Other specified disorders of Eustachian tube, bilateral: Secondary | ICD-10-CM | POA: Diagnosis not present

## 2018-02-22 DIAGNOSIS — E119 Type 2 diabetes mellitus without complications: Secondary | ICD-10-CM | POA: Diagnosis not present

## 2018-02-22 DIAGNOSIS — I10 Essential (primary) hypertension: Secondary | ICD-10-CM | POA: Diagnosis not present

## 2018-04-15 DIAGNOSIS — Z6837 Body mass index (BMI) 37.0-37.9, adult: Secondary | ICD-10-CM | POA: Diagnosis not present

## 2018-04-15 DIAGNOSIS — D649 Anemia, unspecified: Secondary | ICD-10-CM | POA: Diagnosis not present

## 2018-04-15 DIAGNOSIS — Z01419 Encounter for gynecological examination (general) (routine) without abnormal findings: Secondary | ICD-10-CM | POA: Diagnosis not present

## 2018-04-15 DIAGNOSIS — Z1231 Encounter for screening mammogram for malignant neoplasm of breast: Secondary | ICD-10-CM | POA: Diagnosis not present

## 2018-04-15 DIAGNOSIS — N6452 Nipple discharge: Secondary | ICD-10-CM | POA: Diagnosis not present

## 2018-05-13 DIAGNOSIS — M25572 Pain in left ankle and joints of left foot: Secondary | ICD-10-CM | POA: Diagnosis not present

## 2018-05-13 DIAGNOSIS — M25562 Pain in left knee: Secondary | ICD-10-CM | POA: Diagnosis not present

## 2018-05-29 DIAGNOSIS — Z23 Encounter for immunization: Secondary | ICD-10-CM | POA: Diagnosis not present

## 2018-06-07 DIAGNOSIS — H52223 Regular astigmatism, bilateral: Secondary | ICD-10-CM | POA: Diagnosis not present

## 2018-06-07 DIAGNOSIS — Z0100 Encounter for examination of eyes and vision without abnormal findings: Secondary | ICD-10-CM | POA: Diagnosis not present

## 2018-06-19 DIAGNOSIS — Z6837 Body mass index (BMI) 37.0-37.9, adult: Secondary | ICD-10-CM | POA: Diagnosis not present

## 2018-06-19 DIAGNOSIS — R0683 Snoring: Secondary | ICD-10-CM | POA: Diagnosis not present

## 2018-06-19 DIAGNOSIS — R5383 Other fatigue: Secondary | ICD-10-CM | POA: Diagnosis not present

## 2018-06-19 DIAGNOSIS — M791 Myalgia, unspecified site: Secondary | ICD-10-CM | POA: Diagnosis not present

## 2018-06-24 ENCOUNTER — Encounter (INDEPENDENT_AMBULATORY_CARE_PROVIDER_SITE_OTHER): Payer: Self-pay | Admitting: *Deleted

## 2018-07-10 HISTORY — PX: REDUCTION MAMMAPLASTY: SUR839

## 2018-08-10 DIAGNOSIS — M5412 Radiculopathy, cervical region: Secondary | ICD-10-CM | POA: Diagnosis not present

## 2018-08-22 DIAGNOSIS — Z0001 Encounter for general adult medical examination with abnormal findings: Secondary | ICD-10-CM | POA: Diagnosis not present

## 2018-08-22 DIAGNOSIS — E6609 Other obesity due to excess calories: Secondary | ICD-10-CM | POA: Diagnosis not present

## 2018-08-22 DIAGNOSIS — E119 Type 2 diabetes mellitus without complications: Secondary | ICD-10-CM | POA: Diagnosis not present

## 2018-08-22 DIAGNOSIS — E782 Mixed hyperlipidemia: Secondary | ICD-10-CM | POA: Diagnosis not present

## 2018-08-22 DIAGNOSIS — Z6836 Body mass index (BMI) 36.0-36.9, adult: Secondary | ICD-10-CM | POA: Diagnosis not present

## 2018-08-22 DIAGNOSIS — Z1389 Encounter for screening for other disorder: Secondary | ICD-10-CM | POA: Diagnosis not present

## 2018-08-26 ENCOUNTER — Encounter: Payer: Self-pay | Admitting: Gastroenterology

## 2018-09-16 DIAGNOSIS — M542 Cervicalgia: Secondary | ICD-10-CM | POA: Diagnosis not present

## 2018-09-16 DIAGNOSIS — M62838 Other muscle spasm: Secondary | ICD-10-CM | POA: Diagnosis not present

## 2018-09-16 DIAGNOSIS — M2560 Stiffness of unspecified joint, not elsewhere classified: Secondary | ICD-10-CM | POA: Diagnosis not present

## 2018-09-18 DIAGNOSIS — M62838 Other muscle spasm: Secondary | ICD-10-CM | POA: Diagnosis not present

## 2018-09-18 DIAGNOSIS — M542 Cervicalgia: Secondary | ICD-10-CM | POA: Diagnosis not present

## 2018-09-18 DIAGNOSIS — M2560 Stiffness of unspecified joint, not elsewhere classified: Secondary | ICD-10-CM | POA: Diagnosis not present

## 2018-09-23 DIAGNOSIS — M542 Cervicalgia: Secondary | ICD-10-CM | POA: Diagnosis not present

## 2018-09-23 DIAGNOSIS — M5412 Radiculopathy, cervical region: Secondary | ICD-10-CM | POA: Diagnosis not present

## 2018-09-23 DIAGNOSIS — M2560 Stiffness of unspecified joint, not elsewhere classified: Secondary | ICD-10-CM | POA: Diagnosis not present

## 2018-09-24 ENCOUNTER — Ambulatory Visit: Payer: Managed Care, Other (non HMO)

## 2018-09-26 DIAGNOSIS — M62838 Other muscle spasm: Secondary | ICD-10-CM | POA: Diagnosis not present

## 2018-09-26 DIAGNOSIS — M542 Cervicalgia: Secondary | ICD-10-CM | POA: Diagnosis not present

## 2018-09-26 DIAGNOSIS — M2560 Stiffness of unspecified joint, not elsewhere classified: Secondary | ICD-10-CM | POA: Diagnosis not present

## 2018-09-30 ENCOUNTER — Ambulatory Visit: Payer: 59

## 2018-10-01 DIAGNOSIS — M2560 Stiffness of unspecified joint, not elsewhere classified: Secondary | ICD-10-CM | POA: Diagnosis not present

## 2018-10-01 DIAGNOSIS — M542 Cervicalgia: Secondary | ICD-10-CM | POA: Diagnosis not present

## 2018-10-01 DIAGNOSIS — M62838 Other muscle spasm: Secondary | ICD-10-CM | POA: Diagnosis not present

## 2018-10-03 DIAGNOSIS — M62838 Other muscle spasm: Secondary | ICD-10-CM | POA: Diagnosis not present

## 2018-10-03 DIAGNOSIS — M2560 Stiffness of unspecified joint, not elsewhere classified: Secondary | ICD-10-CM | POA: Diagnosis not present

## 2018-10-03 DIAGNOSIS — M542 Cervicalgia: Secondary | ICD-10-CM | POA: Diagnosis not present

## 2018-10-09 DIAGNOSIS — M5412 Radiculopathy, cervical region: Secondary | ICD-10-CM | POA: Diagnosis not present

## 2018-11-13 ENCOUNTER — Ambulatory Visit: Payer: Self-pay

## 2018-11-28 ENCOUNTER — Encounter: Payer: Self-pay | Admitting: Gastroenterology

## 2018-12-18 ENCOUNTER — Ambulatory Visit: Payer: Self-pay

## 2019-02-26 ENCOUNTER — Encounter: Payer: Self-pay | Admitting: *Deleted

## 2019-02-26 ENCOUNTER — Ambulatory Visit (INDEPENDENT_AMBULATORY_CARE_PROVIDER_SITE_OTHER): Payer: Self-pay | Admitting: *Deleted

## 2019-02-26 ENCOUNTER — Other Ambulatory Visit: Payer: Self-pay

## 2019-02-26 DIAGNOSIS — Z1211 Encounter for screening for malignant neoplasm of colon: Secondary | ICD-10-CM

## 2019-02-26 MED ORDER — NA SULFATE-K SULFATE-MG SULF 17.5-3.13-1.6 GM/177ML PO SOLN: 1.0000 | Freq: Once | ORAL | 0 refills | Status: AC

## 2019-02-26 NOTE — Addendum Note (Signed)
Addended by: Metro Kung on: 02/26/2019 01:45 PM   Modules accepted: Orders, SmartSet

## 2019-02-26 NOTE — Progress Notes (Signed)
Mailed letter to patient with medication adjustments.

## 2019-02-26 NOTE — Patient Instructions (Addendum)
Tonya Calhoun   [birth year] MRN: [id number]    Procedure Date: 05/02/2019 Time to register: 8:30 am Place to register: [facility] Procedure Time: 9:30 am Scheduled provider: Dr. Gala Romney  PREPARATION FOR COLONOSCOPY WITH TRI-LYTE SPLIT PREP  Please notify us immediately if you are diabetic, take iron supplements, or if you are on Coumadin or any other blood thinners.   Please hold the following medications:  See letter  You will need to purchase 1 fleet enema and 1 box of Bisacodyl 69m tablets.   2 DAYS BEFORE PROCEDURE:  DATE: 04/30/2019   DAY: Wednesday Begin clear liquid diet AFTER your lunch meal. NO SOLID FOODS after this point.  1 DAY BEFORE PROCEDURE:  DATE: 05/01/2019   DAY: Thursday Continue clear liquids the entire day - NO SOLID FOOD.   Diabetic medications adjustments for today: See letter  At 2:00 pm:  Take 2 Bisacodyl tablets.   At 4:00pm:  Start drinking your solution. Make sure you mix well per instructions on the bottle. Try to drink 1 (one) 8 ounce glass every 10-15 minutes until you have consumed HALF the jug. You should complete by 6:00pm.You must keep the left over solution refrigerated until completed next day.  Continue clear liquids. You must drink plenty of clear liquids to prevent dehyration and kidney failure.     DAY OF PROCEDURE:   DATE: 05/02/2019   DAY: Friday If you take medications for your heart, blood pressure or breathing, you may take these medications.  Diabetic medications adjustments for today: See letter  Five hours before your procedure time @ 4:30 am:  Finish remaining amout of bowel prep, drinking 1 (one) 8 ounce glass every 10-15 minutes until complete. You have two hours to consume remaining prep.   Three hours before your procedure time @ 6:30 am:  Nothing by mouth.   At least one hour before going to the hospital:  Give yourself one Fleet enema. You may take your morning medications with sip of water unless  we have instructed otherwise.      Please see below for Dietary Information.  CLEAR LIQUIDS INCLUDE:  Water Jello (NOT red in color)   Ice Popsicles (NOT red in color)   Tea (sugar ok, no milk/cream) Powdered fruit flavored drinks  Coffee (sugar ok, no milk/cream) Gatorade/ Lemonade/ Kool-Aid  (NOT red in color)   Juice: apple, white grape, white cranberry Soft drinks  Clear bullion, consomme, broth (fat free beef/chicken/vegetable)  Carbonated beverages (any kind)  Strained chicken noodle soup Hard Candy   Remember: Clear liquids are liquids that will allow you to see your fingers on the other side of a clear glass. Be sure liquids are NOT red in color, and not cloudy, but CLEAR.  DO NOT EAT OR DRINK ANY OF THE FOLLOWING:  Dairy products of any kind   Cranberry juice Tomato juice / V8 juice   Grapefruit juice Orange juice     Red grape juice  Do not eat any solid foods, including such foods as: cereal, oatmeal, yogurt, fruits, vegetables, creamed soups, eggs, bread, crackers, pureed foods in a blender, etc.   HELPFUL HINTS FOR DRINKING PREP SOLUTION:   Make sure prep is extremely cold. Mix and refrigerate the the morning of the prep. You may also put in the freezer.   You may try mixing some Crystal Light or Country Time Lemonade if you prefer. Mix in small amounts; add more if necessary.  Try drinking through  a straw  Rinse mouth with water or a mouthwash between glasses, to remove after-taste.  Try sipping on a cold beverage /ice/ popsicles between glasses of prep.  Place a piece of sugar-free hard candy in mouth between glasses.  If you become nauseated, try consuming smaller amounts, or stretch out the time between glasses. Stop for 30-60 minutes, then slowly start back drinking.        OTHER INSTRUCTIONS  You will need a responsible adult at least 51 years of age to accompany you and drive you home. This person must remain in the waiting room during your  procedure. The hospital will cancel your procedure if you do not have a responsible adult with you.   1. Wear loose fitting clothing that is easily removed. 2. Leave jewelry and other valuables at home.  3. Remove all body piercing jewelry and leave at home. 4. Total time from sign-in until discharge is approximately 2-3 hours. 5. You should go home directly after your procedure and rest. You can resume normal activities the day after your procedure. 6. The day of your procedure you should not:  Drive  Make legal decisions  Operate machinery  Drink alcohol  Return to work   You may call the office (Dept: [phone]) before 5:00pm, or page the doctor on call [phone]) after 5:00pm, for further instructions, if necessary.   Insurance Information YOU WILL NEED TO CHECK WITH YOUR INSURANCE COMPANY FOR THE BENEFITS OF COVERAGE YOU HAVE FOR THIS PROCEDURE.  UNFORTUNATELY, NOT ALL INSURANCE COMPANIES HAVE BENEFITS TO COVER ALL OR PART OF THESE TYPES OF PROCEDURES.  IT IS YOUR RESPONSIBILITY TO CHECK YOUR BENEFITS, HOWEVER, WE WILL BE GLAD TO ASSIST YOU WITH ANY CODES YOUR INSURANCE COMPANY MAY NEED.    PLEASE NOTE THAT MOST INSURANCE COMPANIES WILL NOT COVER A SCREENING COLONOSCOPY FOR PEOPLE UNDER THE AGE OF 50  IF YOU HAVE BCBS INSURANCE, YOU MAY HAVE BENEFITS FOR A SCREENING COLONOSCOPY BUT IF POLYPS ARE FOUND THE DIAGNOSIS WILL CHANGE AND THEN YOU MAY HAVE A DEDUCTIBLE THAT WILL NEED TO BE MET. SO PLEASE MAKE SURE YOU CHECK YOUR BENEFITS FOR A SCREENING COLONOSCOPY AS WELL AS A DIAGNOSTIC COLONOSCOPY.

## 2019-02-26 NOTE — Progress Notes (Addendum)
Gastroenterology Pre-Procedure Review  Request Date: 02/26/2019 Requesting Physician: Charleen Kirks, PA-C @ [facility], No previous TCS  PATIENT REVIEW QUESTIONS: The patient responded to the following health history questions as indicated:    1. Diabetes Melitis: Yes 2. Joint replacements in the past 12 months: No 3. Major health problems in the past 3 months: No 4. Has an artificial valve or MVP: No 5. Has a defibrillator: No 6. Has been advised in past to take antibiotics in advance of a procedure like teeth cleaning: No 7. Family history of colon cancer: No  8. Alcohol Use: No 9. History of sleep apnea: No  10. History of coronary artery or other vascular stents placed within the last 12 months: No 11. History of any prior anesthesia complications: No    MEDICATIONS & ALLERGIES:    Patient reports the following regarding taking any blood thinners:   Plavix? No Aspirin? No Coumadin? No Brilinta? No Xarelto? No Eliquis? No Pradaxa? No Savaysa? No Effient? No  Patient confirms/reports the following medications:  Current Outpatient Medications  Medication Sig Dispense Refill  . Blood Glucose Monitoring Suppl ([place] FLEX SYSTEM) w/Device KIT     . Ferrous Sulfate (IRON) 142 (45 Fe) MG TBCR Take by mouth daily.    . hydrochlorothiazide (HYDRODIURIL) 12.5 MG tablet daily.     Marland Kitchen lisinopril (PRINIVIL,ZESTRIL) 2.5 MG tablet daily.     . metFORMIN (GLUCOPHAGE-XR) 500 MG 24 hr tablet daily.     Glory Rosebush VERIO test strip     . Vitamin D, Cholecalciferol, 25 MCG (1000 UT) TABS Take by mouth daily.     No current facility-administered medications for this visit.     Patient confirms/reports the following allergies:  Allergies  Allergen Reactions  . Penicillins Hives  . Ampicillin     No orders of the defined types were placed in this encounter.   AUTHORIZATION INFORMATION Primary Insurance: [facility],  [place] #: [id number],  Group #: [id number] Pre-Cert / Josem Kaufmann required:  Yes, online approval from 19/62/2297-98/[id number]/1194 Pre-Cert / Auth #: [id number]  SCHEDULE INFORMATION: Procedure has been scheduled as follows:  Date: 05/02/2019, Time: 9:30 Location: [facility] with Dr. Gala Romney  This Gastroenterology Pre-Precedure Review Form is being routed to the following provider(s): Roseanne Kaufman, NP

## 2019-02-26 NOTE — Progress Notes (Signed)
Hold iron 7 days prior. No metformin day of procedure. Appropriate.

## 2019-03-12 ENCOUNTER — Other Ambulatory Visit: Payer: Self-pay

## 2019-03-12 ENCOUNTER — Encounter (HOSPITAL_COMMUNITY): Payer: Self-pay

## 2019-03-12 ENCOUNTER — Ambulatory Visit (HOSPITAL_COMMUNITY)
Admission: EM | Admit: 2019-03-12 | Discharge: 2019-03-12 | Disposition: A | Discharge status: Home / Self Care | Payer: 59 | Attending: Family Medicine | Admitting: Family Medicine

## 2019-03-12 DIAGNOSIS — N898 Other specified noninflammatory disorders of vagina: Secondary | ICD-10-CM

## 2019-03-12 HISTORY — DX: Type 2 diabetes mellitus without complications: E11.9

## 2019-03-12 MED ORDER — BETAMETHASONE DIPROPIONATE 0.05 % EX OINT: TOPICAL_OINTMENT | Freq: Every day | CUTANEOUS | 0 refills | Status: DC

## 2019-03-12 NOTE — ED Triage Notes (Signed)
Pt states she has vaginal itch x 3 weeks.

## 2019-03-13 NOTE — ED Provider Notes (Signed)
[facility]   [id number] 03/12/19 Arrival Time: [id number]  ASSESSMENT & PLAN:  1. Vaginal itching     Has already taken Diflucan x 3. Doubt yeast infection. Until she can see her PCP, short trial of: Meds ordered this encounter  Medications  . betamethasone dipropionate (DIPROLENE) 0.05 % ointment    Sig: Apply topically at bedtime.    Dispense:  30 g    Refill:  0    Pending: Labs Reviewed  CERVICOVAGINAL ANCILLARY ONLY   Will notify of any positive results. Instructed to refrain from sexual activity for at least seven days.  Reviewed expectations re: course of current medical issues. Questions answered. Outlined signs and symptoms indicating need for more acute intervention. Patient verbalized understanding. After Visit Summary given.   SUBJECTIVE:  Tonya Calhoun is a 53 y.o. female who presents with complaint of vaginal itching. Onset gradual. First noticed approx 3 w ago. No vaginal discharge reported. Urinary symptoms: none. Afebrile. No abdominal or pelvic pain. No n/v. No vaginal rashes or lesions. Sexually active with single female partner. OTC treatment: none. PCP Rx Diflucan; has taken 3 without relief. Not worried specifically re: STI.  No LMP recorded. Patient has had a hysterectomy.  ROS: As per HPI.  OBJECTIVE:  Vitals:   03/12/19 1743 03/12/19 1746  BP:  117/72  Pulse:  84  Resp:  18  Temp:  97.6 F (36.4 C)  TempSrc:  Oral  SpO2:  100%  Weight: 102.5 kg      General appearance: alert, cooperative, appears stated age and no distress Throat: lips, mucosa, and tongue normal; teeth and gums normal CV: RRR Lungs: CTAB Back: no CVA tenderness; FROM at waist Abdomen: soft, non-tender GU: deferred Skin: warm and dry Psychological: alert and cooperative; normal mood and affect.  No results found for this or any previous visit.  Labs Reviewed  CERVICOVAGINAL ANCILLARY ONLY    Allergies  Allergen Reactions  . Penicillins     Past  Medical History:  Diagnosis Date  . Diabetes mellitus without complication ([place])   . Hypertension    History reviewed. No pertinent family history. Social History   Socioeconomic History  . Marital status: Married    Spouse name: Not on file  . Number of children: Not on file  . Years of education: Not on file  . Highest education level: Not on file  Occupational History  . Not on file  Social Needs  . Financial resource strain: Not on file  . Food insecurity    Worry: Not on file    Inability: Not on file  . Transportation needs    Medical: Not on file    Non-medical: Not on file  Tobacco Use  . Smoking status: Never Smoker  . Smokeless tobacco: Never Used  Substance and Sexual Activity  . Alcohol use: Yes    Frequency: Never  . Drug use: Never  . Sexual activity: Not on file  Lifestyle  . Physical activity    Days per week: Not on file    Minutes per session: Not on file  . Stress: Not on file  Relationships  . Social Herbalist on phone: Not on file    Gets together: Not on file    Attends religious service: Not on file    Active member of club or organization: Not on file    Attends meetings of clubs or organizations: Not on file    Relationship status: Not on file  .  Intimate partner violence    Fear of current or ex partner: Not on file    Emotionally abused: Not on file    Physically abused: Not on file    Forced sexual activity: Not on file  Other Topics Concern  . Not on file  Social History Narrative  . Not on file          Vanessa Kick, MD 03/13/19 [phone]

## 2019-03-14 LAB — CERVICOVAGINAL ANCILLARY ONLY
Bacterial vaginitis: POSITIVE — AB
Candida vaginitis: NEGATIVE
Chlamydia: NEGATIVE
Neisseria Gonorrhea: NEGATIVE
Trichomonas: POSITIVE — AB

## 2019-03-17 ENCOUNTER — Telehealth (HOSPITAL_COMMUNITY): Payer: Self-pay | Admitting: Emergency Medicine

## 2019-03-17 MED ORDER — METRONIDAZOLE 500 MG PO TABS: 500.0000 mg | ORAL_TABLET | Freq: Two times a day (BID) | ORAL | 0 refills | Status: AC

## 2019-03-17 NOTE — Telephone Encounter (Signed)
Bacterial vaginosis is positive. This was not treated at the urgent care visit.  Flagyl 500 mg BID x 7 days #14 no refills sent to patients pharmacy of choice.    Trichomonas is positive. Rx  for Flagyl once was sent to the pharmacy of record. Pt needs education to refrain from sexual intercourse for 7 days to give the medicine time to work. Sexual partners need to be notified and tested/treated. Condoms may reduce risk of reinfection. Recheck for further evaluation if symptoms are not improving.   Patient contacted and made aware of    results, all questions answered

## 2019-04-08 ENCOUNTER — Telehealth: Payer: Self-pay | Admitting: Internal Medicine

## 2019-04-08 MED ORDER — NA SULFATE-K SULFATE-MG SULF 17.5-3.13-1.6 GM/177ML PO SOLN: 1.0000 | Freq: Once | ORAL | 0 refills | Status: AC

## 2019-04-08 NOTE — Telephone Encounter (Signed)
Pt is scheduled procedure with RMR on 10/23. She is asking for her prep to be called into [facility] on [address]. She said her other pharmacy was having a hard time getting her prep in.

## 2019-04-08 NOTE — Telephone Encounter (Signed)
Spoke with an associate at [facility] on [address] and they are holding a Associate Professor for pt.  Called pt and she is aware that RX has been sent.

## 2019-04-08 NOTE — Addendum Note (Signed)
Addended by: Metro Kung on: 04/08/2019 12:03 PM   Modules accepted: Orders

## 2019-04-09 MED ORDER — PEG 3350-KCL-NA BICARB-NACL 420 G PO SOLR: 4000.0000 mL | Freq: Once | ORAL | 0 refills | Status: AC

## 2019-04-09 NOTE — Addendum Note (Signed)
Addended by: Metro Kung on: 04/09/2019 11:40 AM   Modules accepted: Orders

## 2019-04-09 NOTE — Telephone Encounter (Signed)
Pt called back this morning and requested me to switch her prep to Golyt[facility].  She said that Suprep was too expensive.  Pt aware that I will mail out new prep instructions.

## 2019-05-01 ENCOUNTER — Other Ambulatory Visit: Payer: Self-pay

## 2019-05-01 ENCOUNTER — Other Ambulatory Visit (HOSPITAL_COMMUNITY)
Admission: RE | Admit: 2019-05-01 | Discharge: 2019-05-01 | Disposition: A | Discharge status: Home / Self Care | Payer: 59 | Source: Ambulatory Visit | Attending: Internal Medicine | Admitting: Internal Medicine

## 2019-05-01 DIAGNOSIS — Z20828 Contact with and (suspected) exposure to other viral communicable diseases: Secondary | ICD-10-CM | POA: Insufficient documentation

## 2019-05-01 DIAGNOSIS — Z01812 Encounter for preprocedural laboratory examination: Secondary | ICD-10-CM | POA: Insufficient documentation

## 2019-05-01 LAB — SARS CORONAVIRUS 2 (TAT 6-24 HRS): SARS Coronavirus 2: NEGATIVE

## 2019-05-02 ENCOUNTER — Encounter (HOSPITAL_COMMUNITY)
Admission: RE | Disposition: A | Discharge status: Home / Self Care | Payer: Self-pay | Source: Ambulatory Visit | Attending: Internal Medicine

## 2019-05-02 ENCOUNTER — Other Ambulatory Visit: Payer: Self-pay

## 2019-05-02 ENCOUNTER — Ambulatory Visit (HOSPITAL_COMMUNITY)
Admission: RE | Admit: 2019-05-02 | Discharge: 2019-05-02 | Disposition: A | Discharge status: Home / Self Care | Payer: 59 | Source: Ambulatory Visit | Attending: Internal Medicine | Admitting: Internal Medicine

## 2019-05-02 ENCOUNTER — Encounter (HOSPITAL_COMMUNITY): Payer: Self-pay | Admitting: *Deleted

## 2019-05-02 DIAGNOSIS — Z1211 Encounter for screening for malignant neoplasm of colon: Secondary | ICD-10-CM | POA: Diagnosis present

## 2019-05-02 DIAGNOSIS — I10 Essential (primary) hypertension: Secondary | ICD-10-CM | POA: Diagnosis not present

## 2019-05-02 DIAGNOSIS — Z79899 Other long term (current) drug therapy: Secondary | ICD-10-CM | POA: Insufficient documentation

## 2019-05-02 DIAGNOSIS — Z8249 Family history of ischemic heart disease and other diseases of the circulatory system: Secondary | ICD-10-CM | POA: Diagnosis not present

## 2019-05-02 DIAGNOSIS — Z881 Allergy status to other antibiotic agents status: Secondary | ICD-10-CM | POA: Diagnosis not present

## 2019-05-02 DIAGNOSIS — E119 Type 2 diabetes mellitus without complications: Secondary | ICD-10-CM | POA: Insufficient documentation

## 2019-05-02 DIAGNOSIS — Z880 Allergy status to penicillin: Secondary | ICD-10-CM | POA: Insufficient documentation

## 2019-05-02 DIAGNOSIS — Z7984 Long term (current) use of oral hypoglycemic drugs: Secondary | ICD-10-CM | POA: Diagnosis not present

## 2019-05-02 HISTORY — PX: COLONOSCOPY: SHX5424

## 2019-05-02 LAB — GLUCOSE, CAPILLARY: Glucose-Capillary: 111 mg/dL — ABNORMAL HIGH (ref 70–99)

## 2019-05-02 SURGERY — COLONOSCOPY
Anesthesia: Moderate Sedation

## 2019-05-02 MED ORDER — MIDAZOLAM HCL 5 MG/5ML IJ SOLN
INTRAMUSCULAR | Status: DC | PRN
  Administered 2019-05-02: 1 mg via INTRAVENOUS
  Administered 2019-05-02: 2 mg via INTRAVENOUS
  Administered 2019-05-02 (×2): 1 mg via INTRAVENOUS

## 2019-05-02 MED ORDER — MEPERIDINE HCL 50 MG/ML IJ SOLN
INTRAMUSCULAR | Status: AC
  Filled 2019-05-02: qty 1

## 2019-05-02 MED ORDER — MIDAZOLAM HCL 5 MG/5ML IJ SOLN
INTRAMUSCULAR | Status: AC
  Filled 2019-05-02: qty 10

## 2019-05-02 MED ORDER — SODIUM CHLORIDE 0.9 % IV SOLN
INTRAVENOUS | Status: DC
  Administered 2019-05-02: 1000 mL via INTRAVENOUS

## 2019-05-02 MED ORDER — ONDANSETRON HCL 4 MG/2ML IJ SOLN
INTRAMUSCULAR | Status: AC
  Filled 2019-05-02: qty 2

## 2019-05-02 MED ORDER — ONDANSETRON HCL 4 MG/2ML IJ SOLN
INTRAMUSCULAR | Status: DC | PRN
  Administered 2019-05-02: 4 mg via INTRAVENOUS

## 2019-05-02 MED ORDER — MEPERIDINE HCL 100 MG/ML IJ SOLN
INTRAMUSCULAR | Status: DC | PRN
  Administered 2019-05-02: 25 mg
  Administered 2019-05-02: 15 mg via INTRAVENOUS

## 2019-05-02 NOTE — H&P (Signed)
_0 @   Primary Care Physician:  Jake Samples, PA-C Primary Gastroenterologist:  Dr. Gala Romney  Pre-Procedure History & Physical: HPI:  Tonya Calhoun is a 53 y.o. female is here for a screening colonoscopy.  No bowel symptoms.  No family history of colon cancer.  No prior colonoscopy.  Past Medical History:  Diagnosis Date  . Diabetes mellitus   . Diabetes mellitus without complication ([place])   . Hypertension     Past Surgical History:  Procedure Laterality Date  . ANTERIOR AND POSTERIOR REPAIR  09/26/2011   Procedure: ANTERIOR (CYSTOCELE) AND POSTERIOR REPAIR (RECTOCELE);  Surgeon: Delice Lesch, MD;  Location: [facility];  Service: Gynecology;  Laterality: N/A;  . BREAST SURGERY     reduction  . CYSTOSCOPY  09/26/2011   Procedure: CYSTOSCOPY;  Surgeon: Delice Lesch, MD;  Location: [facility];  Service: Gynecology;  Laterality: Bilateral;  . TUBAL LIGATION    . VAGINAL HYSTERECTOMY  09/26/2011   Procedure: HYSTERECTOMY VAGINAL;  Surgeon: Delice Lesch, MD;  Location: [facility];  Service: Gynecology;  Laterality: N/A;  TOTAL VAGINAL HYSTERECTOMY; POSSIBLE ANTERIOR AND POSTERIOR REPAIR, CYSTOSCOPY    Prior to Admission medications   Medication Sig Start Date End Date Taking? Authorizing Provider  clobetasol ointment (TEMOVATE) 7.32 % Apply 1 application topically 2 (two) times daily as needed for dry skin. 01/08/19  Yes [provider]  hydrochlorothiazide (HYDRODIURIL) 12.5 MG tablet Take 12.5 mg by mouth daily.  01/30/17  Yes [provider]  lisinopril (PRINIVIL,ZESTRIL) 2.5 MG tablet 2.5 mg daily.  01/12/17  Yes [provider]  metFORMIN (GLUCOPHAGE-XR) 500 MG 24 hr tablet Take 500 mg by mouth daily with breakfast.  01/12/17  Yes [provider]  Blood Glucose Monitoring Suppl ([place]) w/Device KIT  02/12/17   [provider]  Ferrous Sulfate (IRON) 142 (45 Fe) MG TBCR Take by mouth daily.    [provider]   [facility] VERIO test strip  03/11/17   [provider]  Vitamin D, Cholecalciferol, 25 MCG (1000 UT) TABS Take by mouth daily.    [provider]    Allergies as of 02/26/2019 - Review Complete 02/26/2019  Allergen Reaction Noted  . Penicillins Hives 09/07/2011  . Ampicillin  11/06/2011    Family History  Problem Relation Age of Onset  . Diabetes Mother   . Hypertension Mother   . Prostate cancer Father   . Diabetes Father     Social History   Socioeconomic History  . Marital status: Married    Spouse name: Not on file  . Number of children: Not on file  . Years of education: Not on file  . Highest education level: Not on file  Occupational History  . Not on file  Social Needs  . Financial resource strain: Not on file  . Food insecurity    Worry: Not on file    Inability: Not on file  . Transportation needs    Medical: Not on file    Non-medical: Not on file  Tobacco Use  . Smoking status: Never Smoker  . Smokeless tobacco: Never Used  Substance and Sexual Activity  . Alcohol use: Yes    Frequency: Never    Comment: occasionally  . Drug use: Never  . Sexual activity: Not on file  Lifestyle  . Physical activity    Days per week: Not on file    Minutes per session: Not on file  . Stress: Not on  file  Relationships  . Social Herbalist on phone: Not on file    Gets together: Not on file    Attends religious service: Not on file    Active member of club or organization: Not on file    Attends meetings of clubs or organizations: Not on file    Relationship status: Not on file  . Intimate partner violence    Fear of current or ex partner: Not on file    Emotionally abused: Not on file    Physically abused: Not on file    Forced sexual activity: Not on file  Other Topics Concern  . Not on file  Social History Narrative   ** Merged History Encounter **        Review of Systems: See HPI, otherwise negative ROS  Physical  Exam: BP 109/72   Pulse 77   Temp 97.8 F (36.6 C) (Oral)   Resp 14   Ht 5' 6" (1.676 m)   Wt 103 kg   LMP 08/28/2011   SpO2 100%   BMI 36.64 kg/m  General:   Alert,  Well-developed, well-nourished, pleasant and cooperative in NAD Lungs:  Clear throughout to auscultation.   No wheezes, crackles, or rhonchi. No acute distress. Heart:  Regular rate and rhythm; no murmurs, clicks, rubs,  or gallops. Abdomen:  Soft, nontender and nondistended. No masses, hepatosplenomegaly or hernias noted. Normal bowel sounds, without guarding, and without rebound.    Impression/Plan: Tedd Sias is now here to undergo a screening colonoscopy.  To have her average risk screening examination.  Risks, benefits, limitations, imponderables and alternatives regarding colonoscopy have been reviewed with the patient. Questions have been answered. All parties agreeable.     Notice:  This dictation was prepared with Dragon dictation along with smaller phrase technology. Any transcriptional errors that result from this process are unintentional and may not be corrected upon review.

## 2019-05-02 NOTE — Op Note (Signed)
[facility] Patient Name: Tonya Calhoun Procedure Date: 05/02/2019 8:45 AM MRN: [id number] Date of Birth: 1966-01-13 Attending MD: Norvel Richards , MD CSN: [id number] Age: 53 Admit Type: Outpatient Procedure:                Colonoscopy Indications:              Screening for colorectal malignant neoplasm Providers:                Norvel Richards, MD, Janeece Riggers, RN, Aram Candela Referring MD:              Medicines:                Midazolam 5 mg IV, Meperidine 40 mg IV, Ondansetron                            4 mg IV Complications:            No immediate complications. Estimated Blood Loss:     Estimated blood loss: none. Procedure:                Pre-Anesthesia Assessment:                           - Prior to the procedure, a History and Physical                            was performed, and patient medications and                            allergies were reviewed. The patient's tolerance of                            previous anesthesia was also reviewed. The risks                            and benefits of the procedure and the sedation                            options and risks were discussed with the patient.                            All questions were answered, and informed consent                            was obtained. Prior Anticoagulants: The patient has                            taken no previous anticoagulant or antiplatelet                            agents. ASA Grade Assessment: II - A patient with  mild systemic disease. After reviewing the risks                            and benefits, the patient was deemed in                            satisfactory condition to undergo the procedure.                           After obtaining informed consent, the colonoscope                            was passed under direct vision. Throughout the                            procedure, the patient's  blood pressure, pulse, and                            oxygen saturations were monitored continuously. The                            [id number] scope was introduced through                            the anus and advanced to the the cecum, identified                            by appendiceal orifice and ileocecal valve. The                            colonoscopy was performed without difficulty. The                            patient tolerated the procedure well. The quality                            of the bowel preparation was adequate. The                            ileocecal valve, appendiceal orifice, and rectum                            were photographed. The entire colon was well                            visualized. Scope In: 9:05:57 AM Scope Out: 9:17:33 AM Scope Withdrawal Time: 0 hours 6 minutes 40 seconds  Total Procedure Duration: 0 hours 11 minutes 36 seconds  Findings:      The perianal and digital rectal examinations were normal.      The entire examined colon appeared normal on direct and retroflexion       views. Impression:               - The entire examined colon is normal on direct and  retroflexion views.                           - No specimens collected. Moderate Sedation:      Moderate (conscious) sedation was administered by the endoscopy nurse       and supervised by the endoscopist. The following parameters were       monitored: oxygen saturation, heart rate, blood pressure, respiratory       rate, EKG, adequacy of pulmonary ventilation, and response to care.       Total physician intraservice time was 18 minutes. Recommendation:           - Patient has a contact number available for                            emergencies. The signs and symptoms of potential                            delayed complications were discussed with the                            patient. Return to normal activities tomorrow.                             Written discharge instructions were provided to the                            patient.                           - Resume previous diet.                           - Continue present medications.                           - Repeat colonoscopy in 10 years for screening                            purposes.                           - Return to GI office PRN. Procedure Code(s):        --- Professional ---                           [phone], Colonoscopy, flexible; diagnostic, including                            collection of specimen(s) by brushing or washing,                            when performed (separate procedure)                           G0500, Moderate sedation services provided by the  same physician or other qualified health care                            professional performing a gastrointestinal                            endoscopic service that sedation supports,                            requiring the presence of an independent trained                            observer to assist in the monitoring of the                            patient's level of consciousness and physiological                            status; initial 15 minutes of intra-service time;                            patient age 29 years or older (additional time may                            be reported with [phone], as appropriate) Diagnosis Code(s):        --- Professional ---                           Z12.11, Encounter for screening for malignant                            neoplasm of colon CPT copyright 2019 American Medical Association. All rights reserved. The codes documented in this report are preliminary and upon coder review may  be revised to meet current compliance requirements. Cristopher Estimable. Casie Sturgeon, MD Norvel Richards, MD 05/02/2019 9:23:28 AM This report has been signed electronically. Number of Addenda: 0

## 2019-05-02 NOTE — Discharge Instructions (Signed)
°  Colonoscopy Discharge Instructions  Read the instructions outlined below and refer to this sheet in the next few weeks. These discharge instructions provide you with general information on caring for yourself after you leave the hospital. Your doctor may also give you specific instructions. While your treatment has been planned according to the most current medical practices available, unavoidable complications occasionally occur. If you have any problems or questions after discharge, call Dr. Gala Romney at [phone]. ACTIVITY  You may resume your regular activity, but move at a slower pace for the next 24 hours.   Take frequent rest periods for the next 24 hours.   Walking will help get rid of the air and reduce the bloated feeling in your belly (abdomen).   No driving for 24 hours (because of the medicine (anesthesia) used during the test).    Do not sign any important legal documents or operate any machinery for 24 hours (because of the anesthesia used during the test).  NUTRITION  Drink plenty of fluids.   You may resume your normal diet as instructed by your doctor.   Begin with a light meal and progress to your normal diet. Heavy or fried foods are harder to digest and may make you feel sick to your stomach (nauseated).   Avoid alcoholic beverages for 24 hours or as instructed.  MEDICATIONS  You may resume your normal medications unless your doctor tells you otherwise.  WHAT YOU CAN EXPECT TODAY  Some feelings of bloating in the abdomen.   Passage of more gas than usual.   Spotting of blood in your stool or on the toilet paper.  IF YOU HAD POLYPS REMOVED DURING THE COLONOSCOPY:  No aspirin products for 7 days or as instructed.   No alcohol for 7 days or as instructed.   Eat a soft diet for the next 24 hours.  FINDING OUT THE RESULTS OF YOUR TEST Not all test results are available during your visit. If your test results are not back during the visit, make an appointment  with your caregiver to find out the results. Do not assume everything is normal if you have not heard from your caregiver or the medical facility. It is important for you to follow up on all of your test results.  SEEK IMMEDIATE MEDICAL ATTENTION IF:  You have more than a spotting of blood in your stool.   Your belly is swollen (abdominal distention).   You are nauseated or vomiting.   You have a temperature over 101.   You have abdominal pain or discomfort that is severe or gets worse throughout the day.   Your colonoscopy was normal today  I recommend a repeat colonoscopy in 10 years for screening purposes

## 2019-05-07 ENCOUNTER — Encounter (HOSPITAL_COMMUNITY): Payer: Self-pay | Admitting: Internal Medicine

## 2019-06-03 ENCOUNTER — Other Ambulatory Visit: Payer: Self-pay

## 2019-06-03 DIAGNOSIS — Z20822 Contact with and (suspected) exposure to covid-19: Secondary | ICD-10-CM

## 2019-06-04 LAB — NOVEL CORONAVIRUS, NAA: SARS-CoV-2, NAA: NOT DETECTED

## 2019-12-03 ENCOUNTER — Other Ambulatory Visit: Payer: Self-pay

## 2019-12-03 ENCOUNTER — Encounter (HOSPITAL_COMMUNITY): Payer: Self-pay | Admitting: Emergency Medicine

## 2019-12-03 ENCOUNTER — Emergency Department (HOSPITAL_COMMUNITY)
Admission: EM | Admit: 2019-12-03 | Discharge: 2019-12-04 | Disposition: A | Discharge status: Home / Self Care | Payer: 59 | Attending: Emergency Medicine | Admitting: Emergency Medicine

## 2019-12-03 DIAGNOSIS — E119 Type 2 diabetes mellitus without complications: Secondary | ICD-10-CM | POA: Insufficient documentation

## 2019-12-03 DIAGNOSIS — Z7984 Long term (current) use of oral hypoglycemic drugs: Secondary | ICD-10-CM | POA: Diagnosis not present

## 2019-12-03 DIAGNOSIS — I10 Essential (primary) hypertension: Secondary | ICD-10-CM | POA: Insufficient documentation

## 2019-12-03 DIAGNOSIS — T7840XA Allergy, unspecified, initial encounter: Secondary | ICD-10-CM | POA: Diagnosis not present

## 2019-12-03 DIAGNOSIS — T781XXA Other adverse food reactions, not elsewhere classified, initial encounter: Secondary | ICD-10-CM

## 2019-12-03 MED ORDER — METHYLPREDNISOLONE SODIUM SUCC 125 MG IJ SOLR
125.0000 mg | Freq: Once | INTRAMUSCULAR | Status: AC
  Administered 2019-12-03: 125 mg via INTRAVENOUS
  Filled 2019-12-03: qty 2

## 2019-12-03 MED ORDER — FAMOTIDINE IN NACL 20-0.9 MG/50ML-% IV SOLN
20.0000 mg | Freq: Once | INTRAVENOUS | Status: AC
  Administered 2019-12-03: 20 mg via INTRAVENOUS
  Filled 2019-12-03: qty 50

## 2019-12-03 MED ORDER — SODIUM CHLORIDE 0.9 % IV BOLUS
250.0000 mL | Freq: Once | INTRAVENOUS | Status: AC
  Administered 2019-12-03: 250 mL via INTRAVENOUS

## 2019-12-03 MED ORDER — DIPHENHYDRAMINE HCL 50 MG/ML IJ SOLN
50.0000 mg | Freq: Once | INTRAMUSCULAR | Status: AC
  Administered 2019-12-03: 50 mg via INTRAVENOUS
  Filled 2019-12-03: qty 1

## 2019-12-03 NOTE — ED Triage Notes (Signed)
Pt c/o itching, hives, nasal congestion, and difficulty swallowing since eating at [employer] tonight. States she took 2 benadryl's x 2 hours ago.

## 2019-12-03 NOTE — ED Provider Notes (Signed)
[facility] EMERGENCY DEPARTMENT Provider Note   CSN: [id number] Arrival date & time: 12/03/19  2233     History Chief Complaint  Patient presents with  . Allergic Reaction    Tonya Calhoun is a 54 y.o. female.  Patient complains of itching all over and swelling in her throat.  Patient took some Benadryl at home and itching is improved some  No language interpreter was used.  Allergic Reaction Presenting symptoms: difficulty breathing   Presenting symptoms: no rash   Severity:  Mild Prior allergic episodes:  No prior episodes Context: not animal exposure   Relieved by:  Nothing Worsened by:  Nothing Ineffective treatments:  None tried      Past Medical History:  Diagnosis Date  . Diabetes mellitus   . Diabetes mellitus without complication ([place])   . Hypertension     Patient Active Problem List   Diagnosis Date Noted  . S/P vaginal hysterectomy 09/26/11 11/06/2011    Past Surgical History:  Procedure Laterality Date  . ANTERIOR AND POSTERIOR REPAIR  09/26/2011   Procedure: ANTERIOR (CYSTOCELE) AND POSTERIOR REPAIR (RECTOCELE);  Surgeon: Delice Lesch, MD;  Location: [facility];  Service: Gynecology;  Laterality: N/A;  . BREAST SURGERY     reduction  . COLONOSCOPY N/A 05/02/2019   Procedure: COLONOSCOPY;  Surgeon: Daneil Dolin, MD;  [facility];  Service: Endoscopy;  Laterality: N/A;  9:30  . CYSTOSCOPY  09/26/2011   Procedure: CYSTOSCOPY;  Surgeon: Delice Lesch, MD;  Location: [facility];  Service: Gynecology;  Laterality: Bilateral;  . TUBAL LIGATION    . VAGINAL HYSTERECTOMY  09/26/2011   Procedure: HYSTERECTOMY VAGINAL;  Surgeon: Delice Lesch, MD;  Location: [facility];  Service: Gynecology;  Laterality: N/A;  TOTAL VAGINAL HYSTERECTOMY; POSSIBLE ANTERIOR AND POSTERIOR REPAIR, CYSTOSCOPY     OB History   No obstetric history on file.     Family History  Problem Relation Age of Onset  . Diabetes Mother   . Hypertension Mother   .  Prostate cancer Father   . Diabetes Father     Social History   Tobacco Use  . Smoking status: Never Smoker  . Smokeless tobacco: Never Used  Substance Use Topics  . Alcohol use: Yes    Comment: occasionally  . Drug use: Never    Home Medications Prior to Admission medications   Medication Sig Start Date End Date Taking? Authorizing Provider  Blood Glucose Monitoring Suppl ([place]) w/Device KIT  02/12/17   [provider]  clobetasol ointment (TEMOVATE) 6.96 % Apply 1 application topically 2 (two) times daily as needed for dry skin. 01/08/19   [provider]  Ferrous Sulfate (IRON) 142 (45 Fe) MG TBCR Take by mouth daily.    [provider]  hydrochlorothiazide (HYDRODIURIL) 12.5 MG tablet Take 12.5 mg by mouth daily.  01/30/17   [provider]  lisinopril (PRINIVIL,ZESTRIL) 2.5 MG tablet 2.5 mg daily.  01/12/17   [provider]  metFORMIN (GLUCOPHAGE-XR) 500 MG 24 hr tablet Take 500 mg by mouth daily with breakfast.  01/12/17   [provider]  [facility] VERIO test strip  03/11/17   [provider]  Vitamin D, Cholecalciferol, 25 MCG (1000 UT) TABS Take by mouth daily.    [provider]    Allergies    Penicillins, Ampicillin, and Penicillins  Review of Systems   Review of Systems  Constitutional: Negative for appetite change and fatigue.  HENT:  Negative for congestion, ear discharge and sinus pressure.   Eyes: Negative for discharge.  Respiratory: Negative for cough.   Cardiovascular: Negative for chest pain.  Gastrointestinal: Negative for abdominal pain and diarrhea.  Genitourinary: Negative for frequency and hematuria.  Musculoskeletal: Negative for back pain.  Skin: Negative for rash.  Neurological: Negative for seizures and headaches.  Psychiatric/Behavioral: Negative for hallucinations.    Physical Exam Updated Vital Signs BP (!) 135/91   Pulse 93   Resp (!) 22   Ht _0   (1.676 m)   Wt 104.3 kg   LMP 08/28/2011   SpO2 100%   BMI 37.12 kg/m   Physical Exam Vitals reviewed.  Constitutional:      Appearance: She is well-developed.  HENT:     Head: Normocephalic.     Nose: Congestion present.  Eyes:     General: No scleral icterus.    Conjunctiva/sclera: Conjunctivae normal.  Neck:     Thyroid: No thyromegaly.  Cardiovascular:     Rate and Rhythm: Normal rate and regular rhythm.     Heart sounds: No murmur. No friction rub. No gallop.   Pulmonary:     Breath sounds: No stridor. No wheezing or rales.  Chest:     Chest wall: No tenderness.  Abdominal:     General: There is no distension.     Tenderness: There is no abdominal tenderness. There is no rebound.  Musculoskeletal:        General: Normal range of motion.     Cervical back: Neck supple.  Lymphadenopathy:     Cervical: No cervical adenopathy.  Skin:    Findings: Rash present. No erythema.  Neurological:     Mental Status: She is alert and oriented to person, place, and time.     Motor: No abnormal muscle tone.     Coordination: Coordination normal.  Psychiatric:        Behavior: Behavior normal.     ED Results / Procedures / Treatments   Labs (all labs ordered are listed, but only abnormal results are displayed) Labs Reviewed  CBC WITH DIFFERENTIAL/PLATELET  BASIC METABOLIC PANEL    EKG None  Radiology No results found.  Procedures Procedures (including critical care time)  Medications Ordered in ED Medications  famotidine (PEPCID) IVPB 20 mg premix (20 mg Intravenous New Bag/Given 12/03/19 2253)  methylPREDNISolone sodium succinate (SOLU-MEDROL) 125 mg/2 mL injection 125 mg (125 mg Intravenous Given 12/03/19 2248)  diphenhydrAMINE (BENADRYL) injection 50 mg (50 mg Intravenous Given 12/03/19 2251)  sodium chloride 0.9 % bolus 250 mL (250 mLs Intravenous New Bag/Given 12/03/19 2258)    ED Course  I have reviewed the triage vital signs and the nursing  notes.  Pertinent labs & imaging results that were available during my care of the patient were reviewed by me and considered in my medical decision making (see chart for details). CRITICAL CARE Performed by: Milton Ferguson Total critical care time: 20 minutes Critical care time was exclusive of separately billable procedures and treating other patients. Critical care was necessary to treat or prevent imminent or life-threatening deterioration. Critical care was time spent personally by me on the following activities: development of treatment plan with patient and/or surrogate as well as nursing, discussions with consultants, evaluation of patient's response to treatment, examination of patient, obtaining history from patient or surrogate, ordering and performing treatments and interventions, ordering and review of laboratory studies, ordering and review of radiographic studies, pulse oximetry and re-evaluation of patient's condition.  MDM Rules/Calculators/A&P                     Patient with mild to moderate allergic reaction.  Patient given Benadryl Pepcid and Solu-Medrol and will be reevaluated       This patient presents to the ED for concern of allergic reaction this involves an extensive number of treatment options, and is a complaint that carries with it a high risk of complications and morbidity.  The differential diagnosis includes allergic reaction   Lab Tests:   I Ordered, reviewed, and interpreted labs, which included CBC and chemistries which showed mild elevation white count  Medicines ordered:   I ordered medication Benadryl Pepcid Solu-Medrol for allergic reaction  Imaging Studies ordered:   Additional history obtained:   Additional history obtained from records  Previous records obtained and reviewed  Consultations Obtained:   Reevaluation:  After the interventions stated above, I reevaluated the patient and found improved  Critical  Interventions:  .   Final Clinical Impression(s) / ED Diagnoses Final diagnoses:  None    Rx / DC Orders ED Discharge Orders    None       Milton Ferguson, MD 12/04/19 1100

## 2019-12-04 LAB — CBC WITH DIFFERENTIAL/PLATELET
Abs Immature Granulocytes: 0.04 10*3/uL (ref 0.00–0.07)
Basophils Absolute: 0.1 10*3/uL (ref 0.0–0.1)
Basophils Relative: 0 %
Eosinophils Absolute: 0.3 10*3/uL (ref 0.0–0.5)
Eosinophils Relative: 2 %
HCT: 38.9 % (ref 36.0–46.0)
Hemoglobin: 11.7 g/dL — ABNORMAL LOW (ref 12.0–15.0)
Immature Granulocytes: 0 %
Lymphocytes Relative: 31 %
Lymphs Abs: 3.5 10*3/uL (ref 0.7–4.0)
MCH: 29.3 pg (ref 26.0–34.0)
MCHC: 30.1 g/dL (ref 30.0–36.0)
MCV: 97.3 fL (ref 80.0–100.0)
Monocytes Absolute: 0.8 10*3/uL (ref 0.1–1.0)
Monocytes Relative: 7 %
Neutro Abs: 6.7 10*3/uL (ref 1.7–7.7)
Neutrophils Relative %: 60 %
Platelets: 450 10*3/uL — ABNORMAL HIGH (ref 150–400)
RBC: 4 MIL/uL (ref 3.87–5.11)
RDW: 13.1 % (ref 11.5–15.5)
WBC: 11.4 10*3/uL — ABNORMAL HIGH (ref 4.0–10.5)
nRBC: 0 % (ref 0.0–0.2)

## 2019-12-04 LAB — BASIC METABOLIC PANEL
Anion gap: 9 (ref 5–15)
BUN: 16 mg/dL (ref 6–20)
CO2: 25 mmol/L (ref 22–32)
Calcium: 8.8 mg/dL — ABNORMAL LOW (ref 8.9–10.3)
Chloride: 101 mmol/L (ref 98–111)
Creatinine, Ser: 0.96 mg/dL (ref 0.44–1.00)
GFR calc Af Amer: >60 mL/min (ref >60)
GFR calc non Af Amer: >60 mL/min (ref >60)
Glucose, Bld: 113 mg/dL — ABNORMAL HIGH (ref 70–99)
Potassium: 3.6 mmol/L (ref 3.5–5.1)
Sodium: 135 mmol/L (ref 135–145)

## 2019-12-04 MED ORDER — EPINEPHRINE 0.3 MG/0.3ML IJ SOAJ: 0.3000 mg | INTRAMUSCULAR | 2 refills | Status: DC | PRN

## 2019-12-04 NOTE — ED Provider Notes (Addendum)
Patient signed out to me by Dr. Roderic Palau to monitor after acute allergic reaction.  Upon recheck at 12:45 AM, patient is resting comfortably.  Rashes resolved, no difficulty breathing, no tongue swelling or difficulty swallowing.  Patient will be appropriate for discharge with continued antihistamine therapy.   Orpah Greek, MD 12/04/19 [id number]    Orpah Greek, MD 12/04/19 [phone]

## 2019-12-04 NOTE — Discharge Instructions (Signed)
Take Benadryl 1 tablet every 6 hours for the next 2 days, then as needed for any reaction.  Administer EpiPen for any life-threatening reaction which would include tongue swelling, throat swelling, difficulty swallowing, wheezing and shortness of breath, nausea, vomiting and abdominal pain in association with reaction.  Call 911 if this happens.

## 2019-12-24 NOTE — Progress Notes (Signed)
New Patient Note  RE: Tonya Calhoun MRN: [id number] DOB: 1965-09-23 Date of Office Visit: 12/25/2019  Referring provider: Jake Samples, PA* Primary care provider: Jake Samples, PA-C  Chief Complaint: Allergic Rhinitis  Martin Majestic to dinner a couple of weeks ago with friends ([employer]) and had an allergic reation, throat swelling hives)  History of Present Illness: I had the pleasure of seeing Tonya Calhoun for initial evaluation at the [facility] [place] [facility] [place] on 12/25/2019. She is a 54 y.o. female, who is referred here by Jake Samples, PA-C for the evaluation of allergic reaction.  Patient had reaction on 5/26.  Went to dinner at [employer] with her friends. This was the first time at that restaurant. She had calamari and chicken marsala with water for dinner. She had these foods before with no issues.  She left the restaurant and by the time she got home to [place]. She started to have diarrhea, noted some little bumps on her body. She took a benadryl. She laid down and started to feel congested and felt like her throat was swollen but no visible angioedema.    Patient went to ER and was treated with steroids, benadryl, famotidine. Symptoms resolved within 12 hours.  Denies any changes in diet, medications, personal care products or recent infections. None of her friends had any symptoms. Denies any insect/bug bites.   Patient has been on lisinopril for a few years.   She does have access to epinephrine autoinjector and not needed to use it.   Past work up includes: none. Dietary History: patient has been eating other foods including milk, eggs, peanut, treenuts, sesame, shellfish, seafood, soy, wheat, meats, fruits and vegetables.   Currently avoiding shellfish, mushrooms since then. She had chicken, finned fish, wheat products since then with no issues.  Assessment and Plan: Tonya Calhoun is a 54 y.o. female with: Adverse food reaction Patient had a  reaction on 5/26 after eating at a restaurant. She had calamari and chicken marsala and about 90 minutes afterwards noted diarrhea, bumps on her body, nasal congestion and throat tightness. Treated in the ER with steroid, benadryl, famotidine and improved by the next day. Patient had chicken and finned fish since then with no issues. Denies changes in diet, medications, personal care products or recent infections. She is on lisinopril..   Today's skin testing was borderline positive to lobster only. Negative to mushroom. We do not have calamari skin prick testing available.  Food allergen skin testing has excellent negative predictive value however there is still a 5% chance that the allergy exists. Therefore, we will investigate further with serum specific IgE levels and, if negative then schedule for open graded oral food challenge in the [place] office as that's closer to her.  A laboratory order form has been provided for serum specific IgE against shellfish, squid and mushrooms.  Continue to avid shellfish which includes crab, lobster, shrimp, calamari, mushrooms.   For mild symptoms you can take over the counter antihistamines such as Benadryl and monitor symptoms closely. If symptoms worsen or if you have severe symptoms including breathing issues, throat closure, significant swelling, whole body hives, severe diarrhea and vomiting, lightheadedness then inject epinephrine and seek immediate medical care afterwards.  Food action plan given.  Discussed with patient that if she has an episode of lip, tongue or throat swelling - stop lisinopril and let us and your PCP know. However, given above history I doubt lisinopril caused the above reaction.  Lab Orders     Allergen Profile, Shellfish     Allergen,Squid     Allergen, Mushroom, [phone]  Other allergy screening: Asthma: no Rhino conjunctivitis: no Medication allergy: yes  Penicillin caused hives in her teens Hymenoptera allergy:  no Urticaria: no Eczema:no History of recurrent infections suggestive of immunodeficency: no  Diagnostics: Skin Testing: Select foods. Borderline to lobster.  Results discussed with patient/family.  Food Adult Perc - 12/25/19 1100    Time Antigen Placed 1102    Allergen Manufacturer Lavella Hammock    Location Back    Number of allergen test 27     Control-buffer 50% Glycerol Negative    Control-Histamine 1 mg/ml 2+    1. Peanut Negative    2. Soybean Negative    3. Wheat Negative    4. Sesame Negative    5. Milk, cow Negative    6. Egg White, Chicken Negative    7. Casein Negative    8. Shellfish Mix Negative    9. Fish Mix Negative    18. Catfish Negative    19. Bass Negative    20. Trout Negative    21. Tuna Negative    22. Salmon Negative    23. Flounder Negative    24. Codfish Negative    25. Shrimp Negative    26. Crab Negative    27. Lobster --   +/-   28. Oyster Negative    29. Scallops Negative    47. Mushrooms Negative    49. Onion Negative    55. Grape (White seedless) Negative    70. Garlic Negative           Past Medical History: Patient Active Problem List   Diagnosis Date Noted  . Adverse food reaction 12/25/2019  . S/P vaginal hysterectomy 09/26/11 11/06/2011   Past Medical History:  Diagnosis Date  . Diabetes mellitus   . Diabetes mellitus without complication ([place])   . Hypertension   . Urticaria    Past Surgical History: Past Surgical History:  Procedure Laterality Date  . ANTERIOR AND POSTERIOR REPAIR  09/26/2011   Procedure: ANTERIOR (CYSTOCELE) AND POSTERIOR REPAIR (RECTOCELE);  Surgeon: Delice Lesch, MD;  Location: [facility];  Service: Gynecology;  Laterality: N/A;  . BREAST SURGERY     reduction  . COLONOSCOPY N/A 05/02/2019   Procedure: COLONOSCOPY;  Surgeon: Daneil Dolin, MD;  [facility];  Service: Endoscopy;  Laterality: N/A;  9:30  . CYSTOSCOPY  09/26/2011   Procedure: CYSTOSCOPY;  Surgeon: Delice Lesch, MD;   Location: [facility];  Service: Gynecology;  Laterality: Bilateral;  . TUBAL LIGATION    . VAGINAL HYSTERECTOMY  09/26/2011   Procedure: HYSTERECTOMY VAGINAL;  Surgeon: Delice Lesch, MD;  Location: [facility];  Service: Gynecology;  Laterality: N/A;  TOTAL VAGINAL HYSTERECTOMY; POSSIBLE ANTERIOR AND POSTERIOR REPAIR, CYSTOSCOPY   Medication List:  Current Outpatient Medications  Medication Sig Dispense Refill  . Blood Glucose Monitoring Suppl ([place] FLEX SYSTEM) w/Device KIT     . clobetasol ointment (TEMOVATE) 5.30 % Apply 1 application topically 2 (two) times daily as needed for dry skin.    Marland Kitchen EPINEPHrine (EPIPEN 2-PAK) 0.3 mg/0.3 mL IJ SOAJ injection Inject 0.3 mLs (0.3 mg total) into the muscle as needed for anaphylaxis. 1 each 2  . Ferrous Sulfate (IRON) 142 (45 Fe) MG TBCR Take by mouth daily.    Marland Kitchen lisinopril (PRINIVIL,ZESTRIL) 2.5 MG tablet 2.5 mg daily.     Glory Rosebush  VERIO test strip     . TRULICITY 3 JS/2.8BT SOPN     . Vitamin D, Cholecalciferol, 25 MCG (1000 UT) TABS Take by mouth daily.     No current facility-administered medications for this visit.   Allergies: Allergies  Allergen Reactions  . Penicillins Hives    Did it involve swelling of the face/tongue/throat, SOB, or low BP? No Did it involve sudden or severe rash/hives, skin peeling, or any reaction on the inside of your mouth or nose? No Did you need to seek medical attention at a hospital or doctor's office? No When did it last happen?20 years ago If all above answers are "NO", may proceed with cephalosporin use.  . Ampicillin   . Penicillins    Social History: Social History   Socioeconomic History  . Marital status: Married    Spouse name: Not on file  . Number of children: Not on file  . Years of education: Not on file  . Highest education level: Not on file  Occupational History  . Not on file  Tobacco Use  . Smoking status: Never Smoker  . Smokeless tobacco: Never Used  Vaping Use  .  Vaping Use: Never used  Substance and Sexual Activity  . Alcohol use: Yes    Comment: occasionally  . Drug use: Never  . Sexual activity: Not on file  Other Topics Concern  . Not on file  Social History Narrative   ** Merged History Encounter **       Social Determinants of Health   Financial Resource Strain:   . Difficulty of Paying Living Expenses:   Food Insecurity:   . Worried About Charity fundraiser in the Last Year:   . Arboriculturist in the Last Year:   Transportation Needs:   . Film/video editor (Medical):   Marland Kitchen Lack of Transportation (Non-Medical):   Physical Activity:   . Days of Exercise per Week:   . Minutes of Exercise per Session:   Stress:   . Feeling of Stress :   Social Connections:   . Frequency of Communication with Friends and Family:   . Frequency of Social Gatherings with Friends and Family:   . Attends Religious Services:   . Active Member of Clubs or Organizations:   . Attends Archivist Meetings:   Marland Kitchen Marital Status:    Lives in a house which is 52 years old. Smoking: denies Occupation: agent  Environmental HistoryFreight forwarder in the house: no Carpet in the family room: no Carpet in the bedroom: no Heating: gas Cooling: central Pet: 1 dog x 16 months  Family History: Family History  Problem Relation Age of Onset  . Diabetes Mother   . Hypertension Mother   . Prostate cancer Father   . Diabetes Father   . Allergic rhinitis Neg Hx   . Asthma Neg Hx   . Eczema Neg Hx   . Urticaria Neg Hx    Review of Systems  Constitutional: Negative for appetite change, chills, fever and unexpected weight change.  HENT: Negative for congestion and rhinorrhea.   Eyes: Negative for itching.  Respiratory: Negative for cough, chest tightness, shortness of breath and wheezing.   Cardiovascular: Negative for chest pain.  Gastrointestinal: Negative for abdominal pain.  Genitourinary: Negative for difficulty urinating.  Skin:  Negative for rash.  Allergic/Immunologic: Negative for environmental allergies.  Neurological: Negative for headaches.   Objective: BP 130/84 (BP Location: Right Arm, Patient Position: Sitting, Cuff  Size: Large)   Pulse 100   Temp 97.8 F (36.6 C) (Temporal)   Resp 18   Ht 5' 7.01" (1.702 m)   Wt 229 lb (103.9 kg)   LMP 08/28/2011   SpO2 99%   BMI 35.86 kg/m  Body mass index is 35.86 kg/m. Physical Exam Vitals and nursing note reviewed.  Constitutional:      Appearance: Normal appearance. She is well-developed.  HENT:     Head: Normocephalic and atraumatic.     Right Ear: Tympanic membrane and external ear normal.     Left Ear: Tympanic membrane and external ear normal.     Nose: Nose normal.     Mouth/Throat:     Mouth: Mucous membranes are moist.     Pharynx: Oropharynx is clear.  Eyes:     Conjunctiva/sclera: Conjunctivae normal.  Cardiovascular:     Rate and Rhythm: Normal rate and regular rhythm.     Heart sounds: Normal heart sounds. No murmur heard.  No friction rub. No gallop.   Pulmonary:     Effort: Pulmonary effort is normal.     Breath sounds: Normal breath sounds. No wheezing, rhonchi or rales.  Abdominal:     Palpations: Abdomen is soft.  Musculoskeletal:     Cervical back: Neck supple.  Skin:    General: Skin is warm.     Findings: No rash.  Neurological:     Mental Status: She is alert and oriented to person, place, and time.  Psychiatric:        Mood and Affect: Mood normal.        Behavior: Behavior normal.    The plan was reviewed with the patient/family, and all questions/concerned were addressed.  It was my pleasure to see Kortlyn today and participate in her care. Please feel free to contact me with any questions or concerns.  Sincerely,  Rexene Alberts, DO Allergy & Immunology  [facility] office: [phone] [facility] office: [place] office: [phone]

## 2019-12-25 ENCOUNTER — Other Ambulatory Visit: Payer: Self-pay

## 2019-12-25 ENCOUNTER — Ambulatory Visit: Payer: 59 | Admitting: Allergy

## 2019-12-25 ENCOUNTER — Encounter: Payer: Self-pay | Admitting: Allergy

## 2019-12-25 VITALS — BP 130/84 | HR 100 | Temp 97.8°F | Resp 18 | Ht 67.01 in | Wt 229.0 lb

## 2019-12-25 DIAGNOSIS — T781XXD Other adverse food reactions, not elsewhere classified, subsequent encounter: Secondary | ICD-10-CM | POA: Diagnosis not present

## 2019-12-25 DIAGNOSIS — R197 Diarrhea, unspecified: Secondary | ICD-10-CM | POA: Diagnosis not present

## 2019-12-25 DIAGNOSIS — T781XXA Other adverse food reactions, not elsewhere classified, initial encounter: Secondary | ICD-10-CM | POA: Insufficient documentation

## 2019-12-25 NOTE — Patient Instructions (Addendum)
Today's skin testing was borderline positive to lobster only.   Continue to avid shellfish which includes crab, lobster, shrimp, calamari, mushrooms.  Get bloodwork:  We are ordering labs, so please allow 1-2 weeks for the results to come back. With the newly implemented Cures Act, the labs might be visible to you at the same time that they become visible to me. However, I will not address the results until all of the results are back, so please be patient.  In the meantime, continue recommendations in your patient instructions, including avoidance measures (if applicable), until you hear from me.  For mild symptoms you can take over the counter antihistamines such as Benadryl and monitor symptoms closely. If symptoms worsen or if you have severe symptoms including breathing issues, throat closure, significant swelling, whole body hives, severe diarrhea and vomiting, lightheadedness then inject epinephrine and seek immediate medical care afterwards.  Food action plan given.   If you have an episode of lip, tongue or throat swelling - stop lisinopril and let us and your PCP know.  Follow up in [place] for possible food challenge depending on bloodwork results.

## 2019-12-25 NOTE — Assessment & Plan Note (Addendum)
Patient had a reaction on 5/26 after eating at a restaurant. She had calamari and chicken marsala and about 90 minutes afterwards noted diarrhea, bumps on her body, nasal congestion and throat tightness. Treated in the ER with steroid, benadryl, famotidine and improved by the next day. Patient had chicken and finned fish since then with no issues. Denies changes in diet, medications, personal care products or recent infections. She is on lisinopril..   Today's skin testing was borderline positive to lobster only. Negative to mushroom. We do not have calamari skin prick testing available.  Food allergen skin testing has excellent negative predictive value however there is still a 5% chance that the allergy exists. Therefore, we will investigate further with serum specific IgE levels and, if negative then schedule for open graded oral food challenge in the [place] office as that's closer to her.  A laboratory order form has been provided for serum specific IgE against shellfish, squid and mushrooms.  Continue to avid shellfish which includes crab, lobster, shrimp, calamari, mushrooms.   For mild symptoms you can take over the counter antihistamines such as Benadryl and monitor symptoms closely. If symptoms worsen or if you have severe symptoms including breathing issues, throat closure, significant swelling, whole body hives, severe diarrhea and vomiting, lightheadedness then inject epinephrine and seek immediate medical care afterwards.  Food action plan given.  Discussed with patient that if she has an episode of lip, tongue or throat swelling - stop lisinopril and let us and your PCP know. However, given above history I doubt lisinopril caused the above reaction.

## 2019-12-30 LAB — ALLERGEN PROFILE, SHELLFISH
Clam IgE: <0.1 kU/L
F023-IgE Crab: 0.19 kU/L — AB
F080-IgE Lobster: 0.32 kU/L — AB
F290-IgE Oyster: <0.1 kU/L
Scallop IgE: <0.1 kU/L
Shrimp IgE: 0.48 kU/L — AB

## 2019-12-30 LAB — ALLERGEN, MUSHROOM, RF212: Mushroom IgE: <0.1 kU/L

## 2019-12-30 LAB — ALLERGEN,SQUID: F058-IgE Squid: <0.1 kU/L

## 2020-11-25 ENCOUNTER — Other Ambulatory Visit (HOSPITAL_COMMUNITY): Payer: Self-pay

## 2020-11-25 MED ORDER — TRULICITY 3 MG/0.5ML ~~LOC~~ SOAJ
SUBCUTANEOUS | 5 refills | Status: DC
  Filled 2020-11-25 – 2020-12-13 (×2): qty 2, 28d supply, fill #0
  Filled 2021-01-12: qty 2, 28d supply, fill #1
  Filled 2021-02-16: qty 2, 28d supply, fill #2
  Filled 2021-03-17: qty 2, 28d supply, fill #3
  Filled 2021-04-13: qty 2, 28d supply, fill #4

## 2020-12-10 ENCOUNTER — Other Ambulatory Visit (HOSPITAL_COMMUNITY): Payer: Self-pay

## 2020-12-13 ENCOUNTER — Other Ambulatory Visit (HOSPITAL_COMMUNITY): Payer: Self-pay

## 2020-12-16 ENCOUNTER — Other Ambulatory Visit (HOSPITAL_COMMUNITY): Payer: Self-pay

## 2020-12-16 MED ORDER — EPINEPHRINE 0.3 MG/0.3ML IJ SOAJ
INTRAMUSCULAR | 0 refills | Status: AC
  Filled 2020-12-16: qty 2, 30d supply, fill #0

## 2021-01-12 ENCOUNTER — Other Ambulatory Visit (HOSPITAL_COMMUNITY): Payer: Self-pay

## 2021-01-12 MED ORDER — DOXYCYCLINE MONOHYDRATE 100 MG PO TABS
100.0000 mg | ORAL_TABLET | Freq: Two times a day (BID) | ORAL | 0 refills | Status: DC
  Filled 2021-01-12: qty 14, 7d supply, fill #0

## 2021-01-12 MED ORDER — MUPIROCIN 2 % EX OINT
TOPICAL_OINTMENT | CUTANEOUS | 0 refills | Status: DC
  Filled 2021-01-12: qty 22, 7d supply, fill #0

## 2021-02-17 ENCOUNTER — Other Ambulatory Visit (HOSPITAL_COMMUNITY): Payer: Self-pay

## 2021-03-10 ENCOUNTER — Other Ambulatory Visit (HOSPITAL_COMMUNITY): Payer: Self-pay

## 2021-03-11 ENCOUNTER — Other Ambulatory Visit (HOSPITAL_COMMUNITY): Payer: Self-pay

## 2021-03-11 MED ORDER — ESCITALOPRAM OXALATE 5 MG PO TABS
ORAL_TABLET | ORAL | 2 refills | Status: DC
  Filled 2021-03-11: qty 30, 30d supply, fill #0

## 2021-03-11 MED ORDER — ALPRAZOLAM 0.25 MG PO TABS
ORAL_TABLET | ORAL | 2 refills | Status: DC
  Filled 2021-03-11: qty 20, 7d supply, fill #0

## 2021-03-17 ENCOUNTER — Other Ambulatory Visit (HOSPITAL_COMMUNITY): Payer: Self-pay

## 2021-04-14 ENCOUNTER — Other Ambulatory Visit (HOSPITAL_COMMUNITY): Payer: Self-pay

## 2021-04-15 ENCOUNTER — Other Ambulatory Visit (HOSPITAL_COMMUNITY): Payer: Self-pay

## 2021-06-10 ENCOUNTER — Other Ambulatory Visit: Payer: Self-pay | Admitting: Obstetrics and Gynecology

## 2021-06-10 DIAGNOSIS — N6452 Nipple discharge: Secondary | ICD-10-CM

## 2021-07-27 ENCOUNTER — Ambulatory Visit
Admission: RE | Admit: 2021-07-27 | Discharge: 2021-07-27 | Disposition: A | Discharge status: Home / Self Care | Payer: 59 | Source: Ambulatory Visit | Attending: Obstetrics and Gynecology | Admitting: Obstetrics and Gynecology

## 2021-07-27 DIAGNOSIS — N6452 Nipple discharge: Secondary | ICD-10-CM

## 2021-08-12 ENCOUNTER — Other Ambulatory Visit: Payer: Self-pay | Admitting: Obstetrics and Gynecology

## 2021-08-12 DIAGNOSIS — N6452 Nipple discharge: Secondary | ICD-10-CM

## 2021-08-28 ENCOUNTER — Ambulatory Visit
Admission: RE | Admit: 2021-08-28 | Discharge: 2021-08-28 | Disposition: A | Discharge status: Home / Self Care | Payer: 59 | Source: Ambulatory Visit | Attending: Obstetrics and Gynecology | Admitting: Obstetrics and Gynecology

## 2021-08-28 ENCOUNTER — Other Ambulatory Visit: Payer: Self-pay

## 2021-08-28 DIAGNOSIS — N6452 Nipple discharge: Secondary | ICD-10-CM

## 2021-08-28 MED ORDER — GADOBUTROL 1 MMOL/ML IV SOLN
10.0000 mL | Freq: Once | INTRAVENOUS | Status: AC | PRN
  Administered 2021-08-28: 10 mL via INTRAVENOUS

## 2022-05-17 IMAGING — MR MR BREAST BILAT WO/W CM
8 of 11 series · 33 of 48 positions shown · IV contrast (gadavist)
Comparison: Previous exam(s).

CLINICAL DATA: Patient with history right nipple discharge and
intermittent scabbing lesion over the right nipple.

EXAM:
BILATERAL BREAST MRI WITH AND WITHOUT CONTRAST
TECHNIQUE: Multiplanar, multisequence MR images of both breasts were obtained
prior to and following the intravenous administration of 10 ml of
Gadavist

[Series 2: t2_tirm_tra ipat (a-p) · axial · 3.0mm · 0.78mm/px · 1 of 55 slices shown]
[im 1/55]
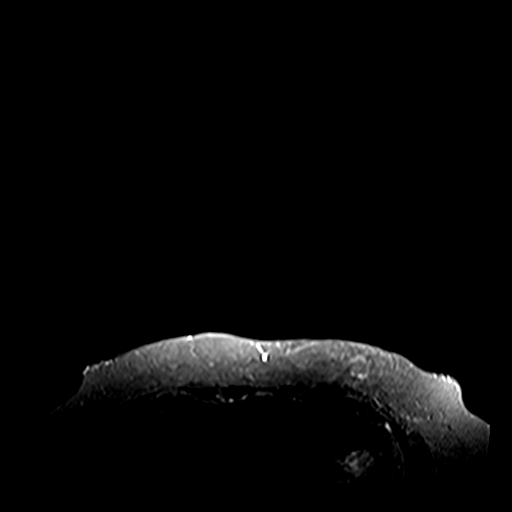

[Series 3: fl3d pre-cm no · axial · non-contrast · 1.2mm · 1.04mm/px · z∈[-71,+82]mm · 5 of 128 slices shown]
[im 1/128]
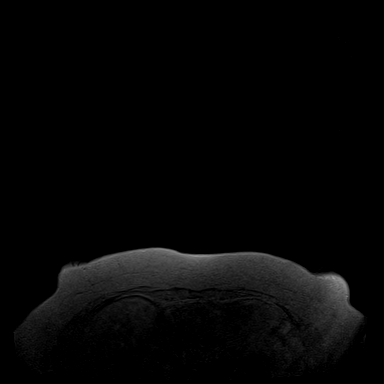
[im 32/128]
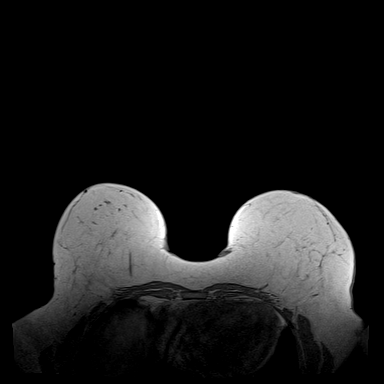
[im 64/128]
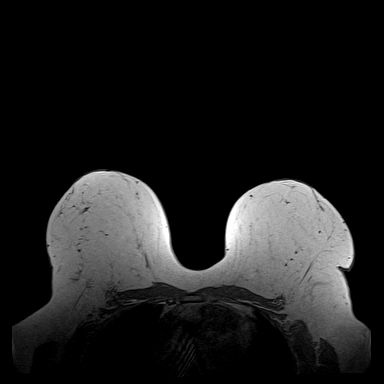
[im 96/128]
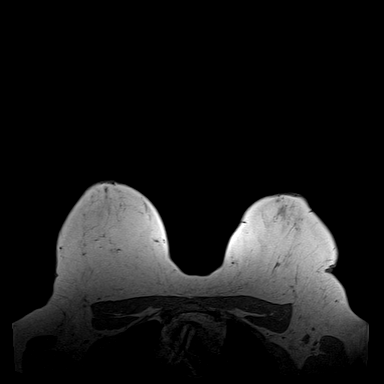
[im 128/128]
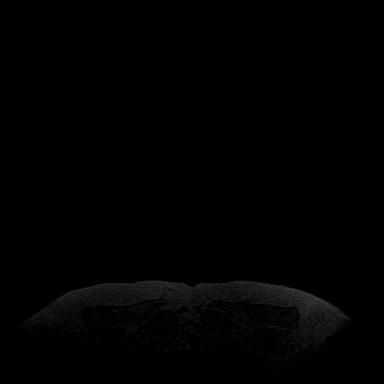

[Series 4: fl3d pre-cm · axial · non-contrast · 1.2mm · 1.04mm/px · z∈[-71,+82]mm · 5 of 128 slices shown]
[im 1/128]
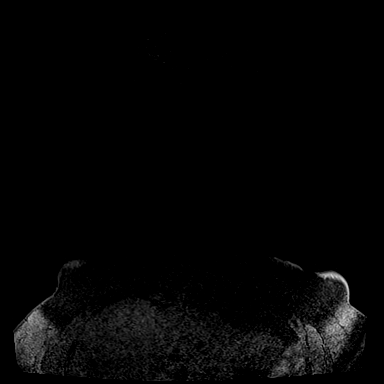
[im 32/128]
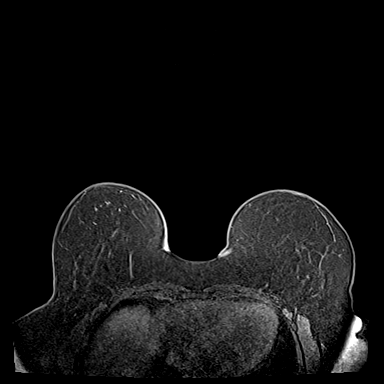
[im 64/128]
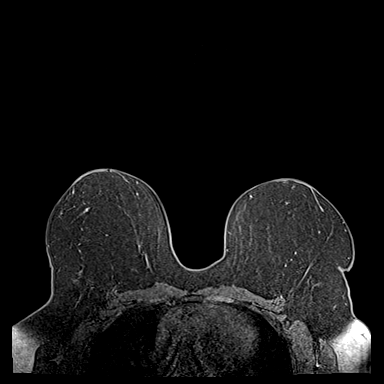
[im 96/128]
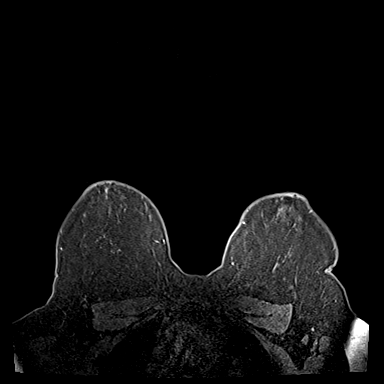
[im 128/128]
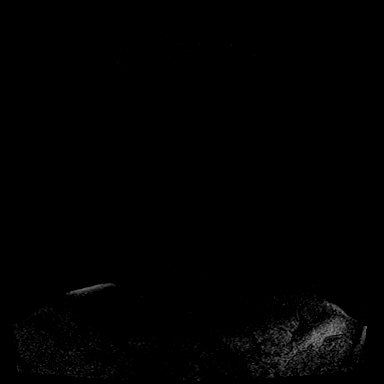

[Series 5: fl3d post-cm 20 · axial · 1.2mm · 1.04mm/px · z∈[-71,+82]mm · 5 of 128 slices shown (1 of 3)]
[im 1/128]
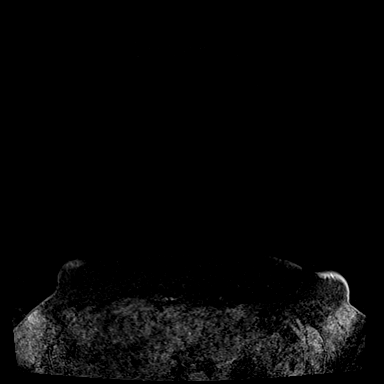
[im 32/128]
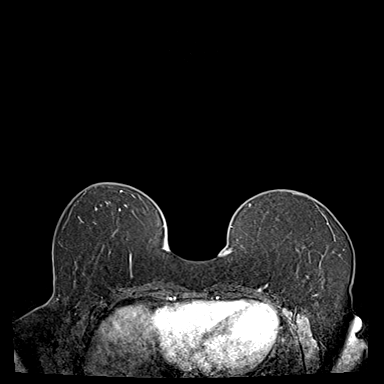
[im 64/128]
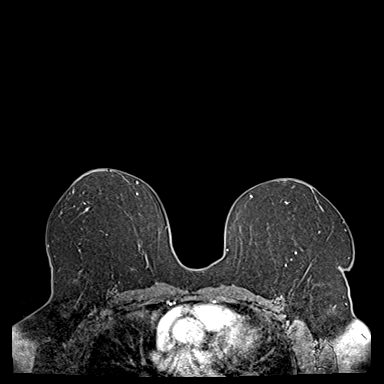
[im 96/128]
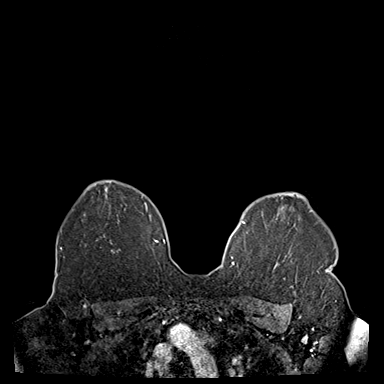
[im 128/128]
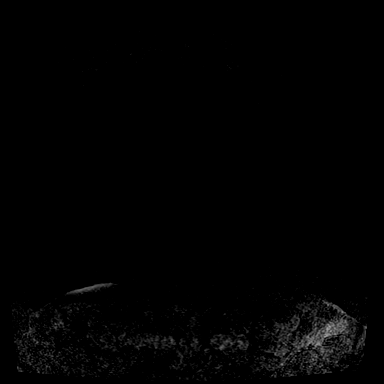

[Series 6: fl3d post-cm 20 · axial · 1.2mm · 1.04mm/px · z∈[-71,+82]mm · 6 of 128 slices shown (2 of 3)]
[im 1/128]
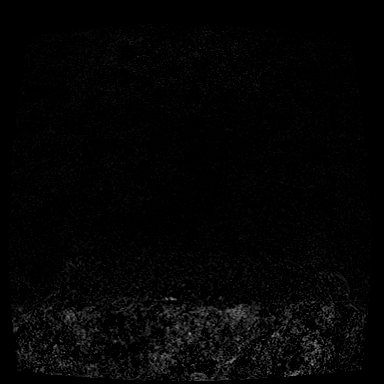
[im 26/128]
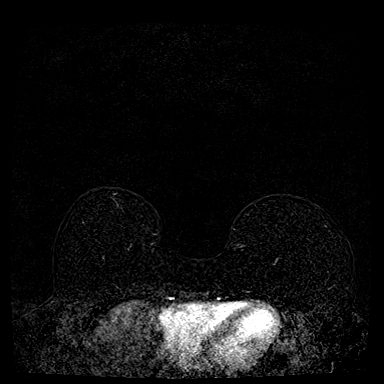
[im 51/128]
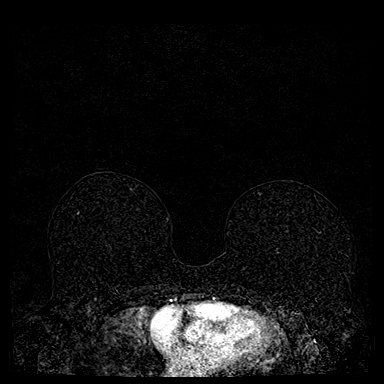
[im 77/128]
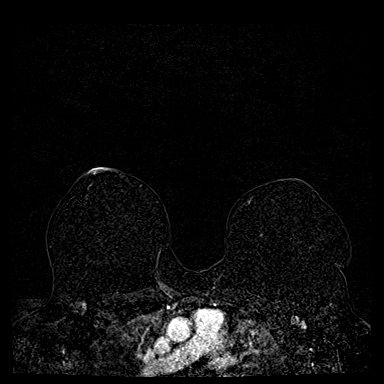
[im 102/128]
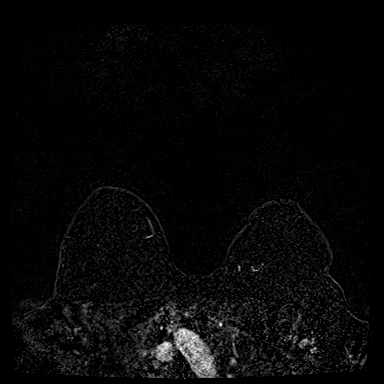
[im 128/128]
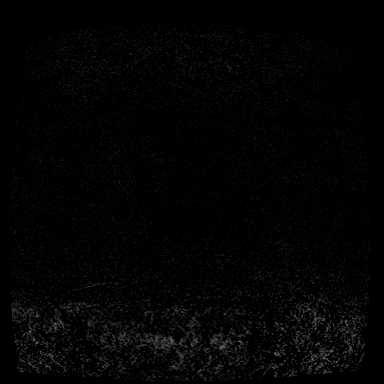

[Series 7: fl3d post-cm 20 · axial · 153.6mm · 1.04mm/px · 1 of 1 slices shown (3 of 3)]
[im 1/1]
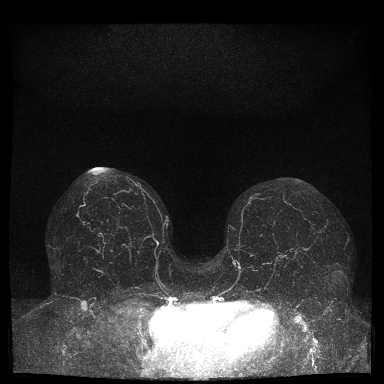

[Series 8: fl3d post-cm 3 · axial · 1.2mm · 1.04mm/px · z∈[-71,+82]mm · 6 of 128 slices shown (1 of 2)]
[im 1/128]
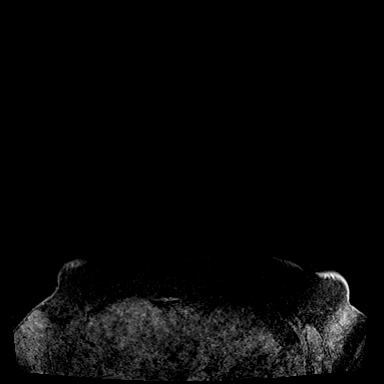
[im 26/128]
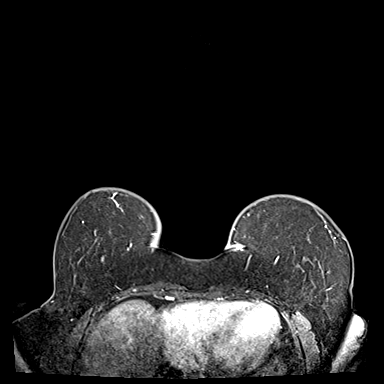
[im 51/128]
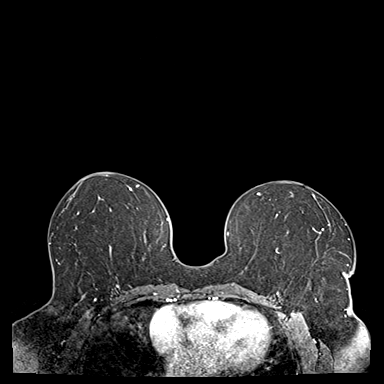
[im 77/128]
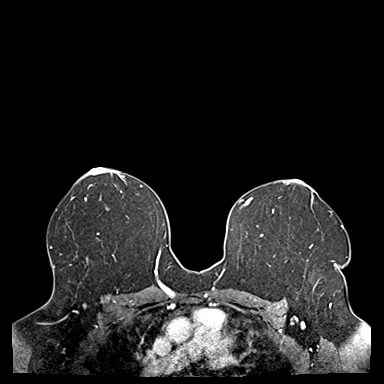
[im 102/128]
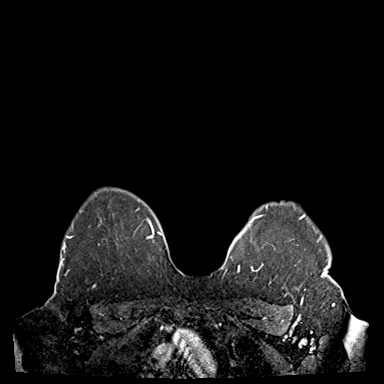
[im 128/128]
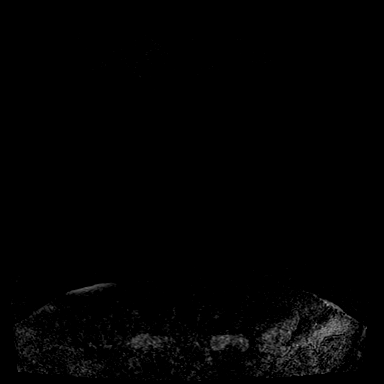

[Series 9: fl3d post-cm 3 · axial · 1.2mm · 1.04mm/px · z∈[-71,+21]mm · 4 of 128 slices shown (2 of 2)]
[im 1/128]
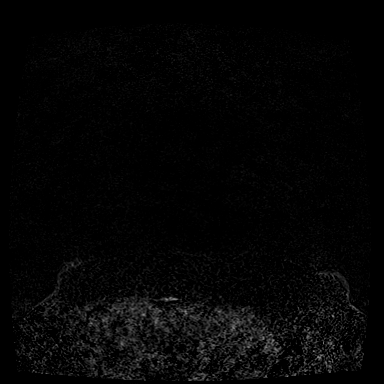
[im 26/128]
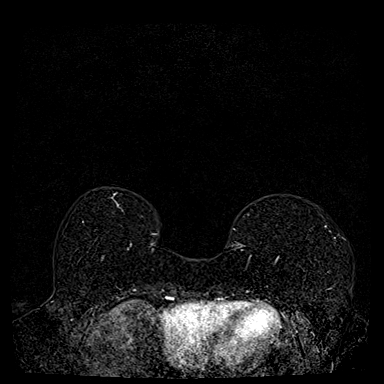
[im 51/128]
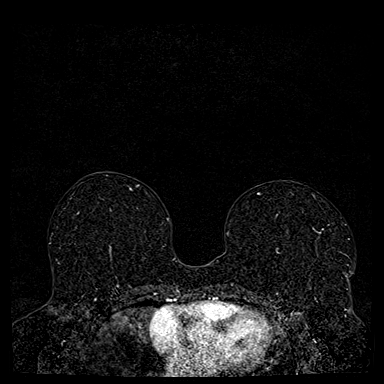
[im 77/128]
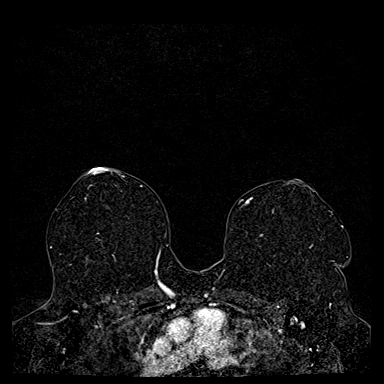

[33 of 48 positions shown; findings below may reference images not displayed]

Three-dimensional MR images were rendered by post-processing of the
original MR data on an independent workstation. The
three-dimensional MR images were interpreted, and findings are
reported in the following complete MRI report for this study. Three
dimensional images were evaluated at the independent interpreting
workstation using the DynaCAD thin client.
FINDINGS: Breast composition: b. Scattered fibroglandular tissue.

Background parenchymal enhancement: Minimal

Right breast: No mass or abnormal enhancement.

Left breast: No mass or abnormal enhancement.

Lymph nodes: No abnormal appearing lymph nodes.

Ancillary findings:  None.
IMPRESSION: No suspicious areas of enhancement identified within the right or
left breast. No causative etiology identified for reported right
nipple discharge.

RECOMMENDATION:
Continued clinical evaluation for reported right nipple discharge.

Annual screening mammography.

BI-RADS CATEGORY  1: Negative.

## 2022-08-10 DIAGNOSIS — C50919 Malignant neoplasm of unspecified site of unspecified female breast: Secondary | ICD-10-CM

## 2022-08-10 HISTORY — DX: Malignant neoplasm of unspecified site of unspecified female breast: C50.919

## 2022-08-11 ENCOUNTER — Other Ambulatory Visit: Payer: Self-pay | Admitting: General Surgery

## 2022-08-15 ENCOUNTER — Other Ambulatory Visit: Payer: Self-pay | Admitting: General Surgery

## 2022-08-15 DIAGNOSIS — N6452 Nipple discharge: Secondary | ICD-10-CM

## 2022-08-18 ENCOUNTER — Other Ambulatory Visit: Payer: Self-pay | Admitting: General Surgery

## 2022-08-18 ENCOUNTER — Telehealth: Payer: Self-pay | Admitting: General Surgery

## 2022-08-18 NOTE — Telephone Encounter (Signed)
Discussed path with patient.  Will plan excision. Mammo scheduled 2/14.  Will see if there is anything that needs excision after that.

## 2022-09-04 ENCOUNTER — Other Ambulatory Visit: Payer: Self-pay | Admitting: General Surgery

## 2022-09-06 ENCOUNTER — Other Ambulatory Visit: Payer: Self-pay

## 2022-09-06 ENCOUNTER — Encounter (HOSPITAL_BASED_OUTPATIENT_CLINIC_OR_DEPARTMENT_OTHER): Payer: Self-pay | Admitting: General Surgery

## 2022-09-07 ENCOUNTER — Encounter (HOSPITAL_BASED_OUTPATIENT_CLINIC_OR_DEPARTMENT_OTHER)
Admission: RE | Admit: 2022-09-07 | Discharge: 2022-09-07 | Disposition: A | Discharge status: Home / Self Care | Payer: 59 | Source: Ambulatory Visit | Attending: General Surgery | Admitting: General Surgery

## 2022-09-07 DIAGNOSIS — Z01818 Encounter for other preprocedural examination: Secondary | ICD-10-CM | POA: Diagnosis present

## 2022-09-07 LAB — BASIC METABOLIC PANEL
Anion gap: 9 (ref 5–15)
BUN: 10 mg/dL (ref 6–20)
CO2: 25 mmol/L (ref 22–32)
Calcium: 9.3 mg/dL (ref 8.9–10.3)
Chloride: 100 mmol/L (ref 98–111)
Creatinine, Ser: 0.92 mg/dL (ref 0.44–1.00)
GFR, Estimated: >60 mL/min (ref >60)
Glucose, Bld: 105 mg/dL — ABNORMAL HIGH (ref 70–99)
Potassium: 4.5 mmol/L (ref 3.5–5.1)
Sodium: 134 mmol/L — ABNORMAL LOW (ref 135–145)

## 2022-09-07 NOTE — Progress Notes (Signed)

## 2022-09-11 ENCOUNTER — Ambulatory Visit
Admission: RE | Admit: 2022-09-11 | Discharge: 2022-09-11 | Disposition: A | Discharge status: Home / Self Care | Payer: 59 | Source: Ambulatory Visit | Attending: General Surgery | Admitting: General Surgery

## 2022-09-11 DIAGNOSIS — N6452 Nipple discharge: Secondary | ICD-10-CM

## 2022-09-12 NOTE — H&P (Signed)
PROVIDER:  Georgianne Fick, MD Patient Care Team: Delman Cheadle, [place] as PCP - General (Family Medicine) Delice Lesch, MD (Obstetrics and Gynecology) Georgianne Fick, MD as Consulting Provider (Surgical Oncology)   MRN: [id number] DOB: 1966-02-13 DATE OF ENCOUNTER: 09/04/2022    Chief Complaint: No chief complaint on file.       History of Present Illness: Tonya Calhoun is a 57 y.o. female who is seen today for breast cancer follow up.   Initial history:    Pt is referred for nipple discharge.  This started around 1 year ago and was associated with crusting over the nipple.  She had dx mammo/us and breast MR which were all negative.  The discharge stopped, but the nipple appearance kept worsening and now has scaling/crusting all the time.  She denies breast pain.     Family cancer history: grandmother   Work:  [employer].    Interval history:    At her last visit, she had a punch biopsy of the nipple 08/11/2022. This showed paget's disease.  We ordered mammogram and ultrasound at [facility]. When she called to schedule, it was going to be after her appointment today. She already had a screening mammogram scheduled at her GYN office, so she had this done already.  We don't have a report, but she said she got a letter saying it was OK.  We are getting this faxed.     She is accompanied by her husband.     She has had a very bad last 1-2 years with multiple family severe illnesses and deaths that she has had to take time off for.  She had her mother, sister, grandmother, and another relative pass.     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 The tumor cells are positive for Her2 (3+). Estrogen Receptor: 0%, NEGATIVE Progesterone Receptor: 0%, NEGATIVE   Review of Systems: A complete review of systems was obtained from the patient.  I have reviewed this  information and discussed as appropriate with the patient.  See HPI as well for other ROS.   ROS o/w negative.       Medical History: Past Medical History      Past Medical History:  Diagnosis Date   Diabetes mellitus without complication (CMS-HCC)     Hypertension             Patient Active Problem List  Diagnosis   Nipple discharge   Abnormal nipple   Paget's disease of nipple, right (CMS-HCC)      Past Surgical History       Past Surgical History:  Procedure Laterality Date   COMBINED REDUCTION MAMMAPLASTY W/ ABDOMINOPLASTY       [facility]       HYSTERECTOMY       LAPAROSCOPIC TUBAL LIGATION            Allergies       Allergies  Allergen Reactions   Penicillins Hives and Other (See Comments)      Did it involve swelling of the face/tongue/throat, SOB, or low BP? No  Did it involve sudden or severe rash/hives, skin peeling, or any reaction on the inside of your mouth or nose? No  Did you need to seek medical attention at a hospital or doctor's office? No  When did it last happen?      20 years ago  If all above answers are "NO", may proceed  with cephalosporin use.              Current Outpatient Medications on File Prior to Visit  Medication Sig Dispense Refill   cholecalciferol (VITAMIN D3) 1000 unit tablet Take by mouth once daily       lisinopriL (ZESTRIL) 2.5 MG tablet Take 1 tablet by mouth once daily       TRULICITY 3 99991111 mL subcutaneous pen injector INJECT '3MG'$  SUBCUTANEOUS EVERY 7 DAYS        No current facility-administered medications on file prior to visit.      Family History       Family History  Problem Relation Age of Onset   Skin cancer Mother     Obesity Mother     High blood pressure (Hypertension) Mother     Diabetes Mother     High blood pressure (Hypertension) Father     Diabetes Father          Social History       Tobacco Use  Smoking Status Never  Smokeless Tobacco Never      Social History  Social History         Socioeconomic History   Marital status: Married  Tobacco Use   Smoking status: Never   Smokeless tobacco: Never  Substance and Sexual Activity   Alcohol use: Yes   Drug use: Never        Objective:      There were no vitals filed for this visit.  There is no height or weight on file to calculate BMI.   Head:   Normocephalic and atraumatic.  Eyes:    Conjunctivae are normal. Pupils are equal, round, and reactive to light. No scleral icterus.  Neck:   Normal range of motion. Neck supple. No tracheal deviation present. No thyromegaly present.  Resp:   No respiratory distress, normal effort. Breast:scaling rash in the center of nipple.  Some of the areola is not involved.  There are scars c/w reduction. Neurological: Alert and oriented to person, place, and time. Coordination normal.  Skin:    Skin is warm and dry. No rash noted. No diaphoretic. No erythema. No pallor.  Psychiatric: Normal mood and affect. Normal behavior. Judgment and thought content normal.      Labs, Imaging and Diagnostic Testing:   None other available.       Assessment and Plan:     Diagnoses and all orders for this visit:   Paget's disease of nipple, right (CMS-HCC)   Get breast ultrasound.     Plan central lumpectomy barring unforeseen issues.  Will refer to radiation and medical oncology.     Discussed this with patient.   The surgical procedure was described to the patient.  I discussed the incision type and location  We discussed the risks bleeding, infection, damage to other structures, need for further procedures/surgeries.  We discussed the risk of seroma.  The patient was advised if the breast has cancer, we may need to go back to surgery for additional tissue to obtain negative margins or for a lymph node biopsy. The patient was advised that these are the most common complications, but that others can occur as well. I discussed the risk of alteration in breast contour or size.  I discussed  risk of chronic pain.  There are rare instances of heart/lung issues post op as well as blood clots.      They were advised against taking aspirin or other anti-inflammatory agents/blood thinners  the week before surgery.     The risks and benefits of the procedure were described to the patient and she wishes to proceed.

## 2022-09-13 ENCOUNTER — Other Ambulatory Visit: Payer: Self-pay

## 2022-09-13 ENCOUNTER — Encounter (HOSPITAL_BASED_OUTPATIENT_CLINIC_OR_DEPARTMENT_OTHER)
Admission: RE | Disposition: A | Discharge status: Home / Self Care | Payer: Self-pay | Source: Home / Self Care | Attending: General Surgery

## 2022-09-13 ENCOUNTER — Ambulatory Visit (HOSPITAL_BASED_OUTPATIENT_CLINIC_OR_DEPARTMENT_OTHER): Payer: 59 | Admitting: Anesthesiology

## 2022-09-13 ENCOUNTER — Encounter (HOSPITAL_BASED_OUTPATIENT_CLINIC_OR_DEPARTMENT_OTHER): Payer: Self-pay | Admitting: General Surgery

## 2022-09-13 ENCOUNTER — Ambulatory Visit (HOSPITAL_BASED_OUTPATIENT_CLINIC_OR_DEPARTMENT_OTHER)
Admission: RE | Admit: 2022-09-13 | Discharge: 2022-09-13 | Disposition: A | Discharge status: Home / Self Care | Payer: 59 | Attending: General Surgery | Admitting: General Surgery

## 2022-09-13 DIAGNOSIS — Z833 Family history of diabetes mellitus: Secondary | ICD-10-CM | POA: Diagnosis not present

## 2022-09-13 DIAGNOSIS — I10 Essential (primary) hypertension: Secondary | ICD-10-CM | POA: Diagnosis not present

## 2022-09-13 DIAGNOSIS — E669 Obesity, unspecified: Secondary | ICD-10-CM | POA: Insufficient documentation

## 2022-09-13 DIAGNOSIS — Z6838 Body mass index (BMI) 38.0-38.9, adult: Secondary | ICD-10-CM | POA: Diagnosis not present

## 2022-09-13 DIAGNOSIS — Z7985 Long-term (current) use of injectable non-insulin antidiabetic drugs: Secondary | ICD-10-CM | POA: Diagnosis not present

## 2022-09-13 DIAGNOSIS — D0511 Intraductal carcinoma in situ of right breast: Secondary | ICD-10-CM | POA: Diagnosis present

## 2022-09-13 DIAGNOSIS — Z8249 Family history of ischemic heart disease and other diseases of the circulatory system: Secondary | ICD-10-CM | POA: Diagnosis not present

## 2022-09-13 DIAGNOSIS — C50011 Malignant neoplasm of nipple and areola, right female breast: Secondary | ICD-10-CM

## 2022-09-13 DIAGNOSIS — Z809 Family history of malignant neoplasm, unspecified: Secondary | ICD-10-CM | POA: Diagnosis not present

## 2022-09-13 DIAGNOSIS — E119 Type 2 diabetes mellitus without complications: Secondary | ICD-10-CM

## 2022-09-13 HISTORY — PX: BREAST LUMPECTOMY: SHX2

## 2022-09-13 LAB — GLUCOSE, CAPILLARY
Glucose-Capillary: 95 mg/dL (ref 70–99)
Glucose-Capillary: 115 mg/dL — ABNORMAL HIGH (ref 70–99)

## 2022-09-13 SURGERY — BREAST LUMPECTOMY
Anesthesia: General | Site: Breast | Laterality: Right

## 2022-09-13 MED ORDER — CIPROFLOXACIN IN D5W 400 MG/200ML IV SOLN
INTRAVENOUS | Status: AC
  Filled 2022-09-13: qty 200

## 2022-09-13 MED ORDER — OXYCODONE HCL 5 MG PO TABS: 5.0000 mg | ORAL_TABLET | Freq: Four times a day (QID) | ORAL | 0 refills | Status: DC | PRN

## 2022-09-13 MED ORDER — ACETAMINOPHEN 325 MG PO TABS: 325.0000 mg | ORAL_TABLET | ORAL | Status: DC | PRN

## 2022-09-13 MED ORDER — OXYCODONE HCL 5 MG/5ML PO SOLN: 5.0000 mg | Freq: Once | ORAL | Status: DC | PRN

## 2022-09-13 MED ORDER — FENTANYL CITRATE (PF) 100 MCG/2ML IJ SOLN
INTRAMUSCULAR | Status: AC
  Filled 2022-09-13: qty 2

## 2022-09-13 MED ORDER — PHENYLEPHRINE HCL (PRESSORS) 10 MG/ML IV SOLN
INTRAVENOUS | Status: DC | PRN
  Administered 2022-09-13: 160 ug via INTRAVENOUS
  Administered 2022-09-13: 80 ug via INTRAVENOUS

## 2022-09-13 MED ORDER — CHLORHEXIDINE GLUCONATE CLOTH 2 % EX PADS: 6.0000 | MEDICATED_PAD | Freq: Once | CUTANEOUS | Status: DC

## 2022-09-13 MED ORDER — LIDOCAINE HCL (CARDIAC) PF 100 MG/5ML IV SOSY
PREFILLED_SYRINGE | INTRAVENOUS | Status: DC | PRN
  Administered 2022-09-13: 8 mg via INTRAVENOUS

## 2022-09-13 MED ORDER — CIPROFLOXACIN IN D5W 400 MG/200ML IV SOLN
400.0000 mg | INTRAVENOUS | Status: AC
  Administered 2022-09-13: 400 mg via INTRAVENOUS

## 2022-09-13 MED ORDER — ONDANSETRON HCL 4 MG/2ML IJ SOLN: 4.0000 mg | Freq: Once | INTRAMUSCULAR | Status: DC | PRN

## 2022-09-13 MED ORDER — KETOROLAC TROMETHAMINE 30 MG/ML IJ SOLN
INTRAMUSCULAR | Status: DC | PRN
  Administered 2022-09-13: 30 mg via INTRAVENOUS

## 2022-09-13 MED ORDER — DEXMEDETOMIDINE HCL IN NACL 80 MCG/20ML IV SOLN
INTRAVENOUS | Status: AC
  Filled 2022-09-13: qty 20

## 2022-09-13 MED ORDER — FENTANYL CITRATE (PF) 100 MCG/2ML IJ SOLN
INTRAMUSCULAR | Status: DC | PRN
  Administered 2022-09-13 (×2): 50 ug via INTRAVENOUS

## 2022-09-13 MED ORDER — MIDAZOLAM HCL 5 MG/5ML IJ SOLN
INTRAMUSCULAR | Status: DC | PRN
  Administered 2022-09-13: 2 mg via INTRAVENOUS

## 2022-09-13 MED ORDER — LACTATED RINGERS IV SOLN: INTRAVENOUS | Status: DC

## 2022-09-13 MED ORDER — ACETAMINOPHEN 160 MG/5ML PO SOLN: 325.0000 mg | ORAL | Status: DC | PRN

## 2022-09-13 MED ORDER — MIDAZOLAM HCL 2 MG/2ML IJ SOLN
INTRAMUSCULAR | Status: AC
  Filled 2022-09-13: qty 2

## 2022-09-13 MED ORDER — KETOROLAC TROMETHAMINE 30 MG/ML IJ SOLN
INTRAMUSCULAR | Status: AC
  Filled 2022-09-13: qty 1

## 2022-09-13 MED ORDER — DEXAMETHASONE SODIUM PHOSPHATE 10 MG/ML IJ SOLN
INTRAMUSCULAR | Status: AC
  Filled 2022-09-13: qty 1

## 2022-09-13 MED ORDER — OXYCODONE HCL 5 MG PO TABS: 5.0000 mg | ORAL_TABLET | Freq: Once | ORAL | Status: DC | PRN

## 2022-09-13 MED ORDER — LIDOCAINE 2% (20 MG/ML) 5 ML SYRINGE
INTRAMUSCULAR | Status: AC
  Filled 2022-09-13: qty 5

## 2022-09-13 MED ORDER — ONDANSETRON HCL 4 MG/2ML IJ SOLN
INTRAMUSCULAR | Status: AC
  Filled 2022-09-13: qty 2

## 2022-09-13 MED ORDER — FENTANYL CITRATE (PF) 100 MCG/2ML IJ SOLN
25.0000 ug | INTRAMUSCULAR | Status: DC | PRN
  Administered 2022-09-13: 50 ug via INTRAVENOUS

## 2022-09-13 MED ORDER — PROPOFOL 10 MG/ML IV BOLUS
INTRAVENOUS | Status: DC | PRN
  Administered 2022-09-13: 120 mg via INTRAVENOUS

## 2022-09-13 MED ORDER — DEXMEDETOMIDINE HCL IN NACL 80 MCG/20ML IV SOLN
INTRAVENOUS | Status: DC | PRN
  Administered 2022-09-13: 12 ug via BUCCAL

## 2022-09-13 MED ORDER — PHENYLEPHRINE 80 MCG/ML (10ML) SYRINGE FOR IV PUSH (FOR BLOOD PRESSURE SUPPORT)
PREFILLED_SYRINGE | INTRAVENOUS | Status: AC
  Filled 2022-09-13: qty 10

## 2022-09-13 MED ORDER — SUCCINYLCHOLINE CHLORIDE 200 MG/10ML IV SOSY
PREFILLED_SYRINGE | INTRAVENOUS | Status: AC
  Filled 2022-09-13: qty 10

## 2022-09-13 MED ORDER — EPHEDRINE 5 MG/ML INJ
INTRAVENOUS | Status: AC
  Filled 2022-09-13: qty 5

## 2022-09-13 MED ORDER — ACETAMINOPHEN 500 MG PO TABS
1000.0000 mg | ORAL_TABLET | ORAL | Status: AC
  Administered 2022-09-13: 1000 mg via ORAL

## 2022-09-13 MED ORDER — ONDANSETRON HCL 4 MG/2ML IJ SOLN
INTRAMUSCULAR | Status: DC | PRN
  Administered 2022-09-13: 4 mg via INTRAVENOUS

## 2022-09-13 MED ORDER — LIDOCAINE-EPINEPHRINE (PF) 1 %-1:200000 IJ SOLN
INTRAMUSCULAR | Status: DC | PRN
  Administered 2022-09-13: 36 mL

## 2022-09-13 MED ORDER — ACETAMINOPHEN 500 MG PO TABS
ORAL_TABLET | ORAL | Status: AC
  Filled 2022-09-13: qty 2

## 2022-09-13 MED ORDER — MEPERIDINE HCL 25 MG/ML IJ SOLN: 6.2500 mg | INTRAMUSCULAR | Status: DC | PRN

## 2022-09-13 MED ORDER — DEXAMETHASONE SODIUM PHOSPHATE 4 MG/ML IJ SOLN
INTRAMUSCULAR | Status: DC | PRN
  Administered 2022-09-13: 5 mg via INTRAVENOUS

## 2022-09-13 MED ORDER — PROPOFOL 500 MG/50ML IV EMUL
INTRAVENOUS | Status: DC | PRN
  Administered 2022-09-13: 200 ug/kg/min via INTRAVENOUS

## 2022-09-13 SURGICAL SUPPLY — 51 items
ADH SKN CLS APL DERMABOND .7 (GAUZE/BANDAGES/DRESSINGS) ×1
APL PRP STRL LF DISP 70% ISPRP (MISCELLANEOUS) ×1
BINDER BREAST XXLRG (GAUZE/BANDAGES/DRESSINGS) IMPLANT
BLADE SURG 15 STRL LF DISP TIS (BLADE) ×1 IMPLANT
BLADE SURG 15 STRL SS (BLADE) ×1
CANISTER SUCT 1200ML W/VALVE (MISCELLANEOUS) ×1 IMPLANT
CHLORAPREP W/TINT 26 (MISCELLANEOUS) ×1 IMPLANT
CLIP TI LARGE 6 (CLIP) IMPLANT
COVER BACK TABLE 60X90IN (DRAPES) ×1 IMPLANT
COVER MAYO STAND STRL (DRAPES) ×1 IMPLANT
DERMABOND ADVANCED .7 DNX12 (GAUZE/BANDAGES/DRESSINGS) ×1 IMPLANT
DRAPE LAPAROTOMY 100X72 PEDS (DRAPES) ×1 IMPLANT
DRAPE UTILITY XL STRL (DRAPES) ×2 IMPLANT
ELECT COATED BLADE 2.86 ST (ELECTRODE) ×1 IMPLANT
ELECT REM PT RETURN 9FT ADLT (ELECTROSURGICAL) ×1
ELECTRODE REM PT RTRN 9FT ADLT (ELECTROSURGICAL) ×1 IMPLANT
GAUZE PAD ABD 8X10 STRL (GAUZE/BANDAGES/DRESSINGS) IMPLANT
GAUZE SPONGE 4X4 12PLY STRL LF (GAUZE/BANDAGES/DRESSINGS) ×1 IMPLANT
GLOVE BIO SURGEON STRL SZ 6 (GLOVE) ×1 IMPLANT
GLOVE BIOGEL PI IND STRL 6.5 (GLOVE) ×1 IMPLANT
GOWN STRL REUS W/ TWL LRG LVL3 (GOWN DISPOSABLE) ×1 IMPLANT
GOWN STRL REUS W/TWL 2XL LVL3 (GOWN DISPOSABLE) ×1 IMPLANT
GOWN STRL REUS W/TWL LRG LVL3 (GOWN DISPOSABLE) ×1
KIT MARKER MARGIN INK (KITS) IMPLANT
LIGHT WAVEGUIDE WIDE FLAT (MISCELLANEOUS) IMPLANT
NDL HYPO 25X1 1.5 SAFETY (NEEDLE) ×1 IMPLANT
NEEDLE HYPO 25X1 1.5 SAFETY (NEEDLE) ×1 IMPLANT
NS IRRIG 1000ML POUR BTL (IV SOLUTION) ×1 IMPLANT
PACK BASIN DAY SURGERY FS (CUSTOM PROCEDURE TRAY) ×1 IMPLANT
PENCIL SMOKE EVACUATOR (MISCELLANEOUS) ×1 IMPLANT
SLEEVE SCD COMPRESS KNEE MED (STOCKING) ×1 IMPLANT
SPIKE FLUID TRANSFER (MISCELLANEOUS) IMPLANT
SPONGE T-LAP 18X18 ~~LOC~~+RFID (SPONGE) ×1 IMPLANT
STAPLER VISISTAT 35W (STAPLE) IMPLANT
STRIP CLOSURE SKIN 1/2X4 (GAUZE/BANDAGES/DRESSINGS) ×1 IMPLANT
SUT MNCRL AB 3-0 PS2 18 (SUTURE) IMPLANT
SUT MON AB 4-0 PC3 18 (SUTURE) ×1 IMPLANT
SUT SILK 2 0 SH (SUTURE) IMPLANT
SUT VIC AB 2-0 SH 27 (SUTURE)
SUT VIC AB 2-0 SH 27XBRD (SUTURE) IMPLANT
SUT VIC AB 3-0 54X BRD REEL (SUTURE) IMPLANT
SUT VIC AB 3-0 BRD 54 (SUTURE)
SUT VIC AB 3-0 SH 27 (SUTURE)
SUT VIC AB 3-0 SH 27X BRD (SUTURE) ×1 IMPLANT
SUT VICRYL 3-0 CR8 SH (SUTURE) IMPLANT
SYR BULB EAR ULCER 3OZ GRN STR (SYRINGE) ×1 IMPLANT
SYR CONTROL 10ML LL (SYRINGE) ×1 IMPLANT
TOWEL GREEN STERILE FF (TOWEL DISPOSABLE) ×1 IMPLANT
TRAY FAXITRON CT DISP (TRAY / TRAY PROCEDURE) IMPLANT
TUBE CONNECTING 20X1/4 (TUBING) ×1 IMPLANT
YANKAUER SUCT BULB TIP NO VENT (SUCTIONS) ×1 IMPLANT

## 2022-09-13 NOTE — Interval H&P Note (Signed)
History and Physical Interval Note:  09/13/2022 1:23 PM  Tonya Calhoun  has presented today for surgery, with the diagnosis of PAGETS DISEASE RIGHT BREAST.  The various methods of treatment have been discussed with the patient and family. After consideration of risks, benefits and other options for treatment, the patient has consented to  Procedure(s): RIGHT BREAST CENTRAL LUMPECTOMY (Right) as a surgical intervention.  The patient's history has been reviewed, patient examined, no change in status, stable for surgery.  I have reviewed the patient's chart and labs.  Questions were answered to the patient's satisfaction.     Stark Klein

## 2022-09-13 NOTE — Op Note (Signed)
Right Breast central quadrantectomy  Indications: This patient presents with history of nipple changes and pathology positive for paget's disease of the breast  Pre-operative Diagnosis: right breast cancer, ductal carcinoma in situ, ER+/PR+, cTis  Post-operative Diagnosis: right breast cancer  Surgeon: Stark Klein   Assistants: Malachi Pro, PA-C  Anesthesia: General LMA anesthesia  ASA Class: 3  Procedure Details  The patient was seen in the Holding Room. The risks, benefits, complications, treatment options, and expected outcomes were discussed with the patient. The possibilities of reaction to medication, pulmonary aspiration, bleeding, infection, the need for additional procedures, failure to diagnose a condition, and creating a complication requiring transfusion or operation were discussed with the patient. The patient concurred with the proposed plan, giving informed consent.  The site of surgery properly noted/marked. The patient was taken to Operating Room # 8, identified as Tedd Sias and the procedure verified as Right Breast central quadrantectomy. A Time Out was held and the above information confirmed.   After induction of anesthesia, the right arm, breast, and chest were prepped and draped in standard fashion.  The lumpectomy was performed by creating a circular incision around the affected nipple and areola. Originally, I had planned to leave some of the areola, but due to the prior reduction, there only would have been a few mm of areolar skin left inside the reduction scar which would compromise the skin integrity.   Skin hooks were used to elevate the skin and the cautery was used to dissect the NAC and underlying breast tissue away.  The specimen was marked with sutures for orientation.  Hemostasis was achieved with cautery.  I used a 2-0 vicryl to pursestring a bit of the underlying breast tissue together to minimize the defect.  I tried to pursestring the skin  together to create more of a nipple appearance, but it was too much pucker and wouldn't close together enough.  The medial and lateral dog ears were removed and the incision was closed transversely with a 3-0 Vicryl deep dermal interrupted and a 4-0 Monocryl subcuticular closure in layers.      Sterile dressings were applied. At the end of the operation, all sponge, instrument, and needle counts were correct.  Findings: Nipple scaling consistent with paget's disease  Estimated Blood Loss:  Minimal         Specimens: central quadrant.  Additional medial margin, additional lateral margin.                  Complications:  None; patient tolerated the procedure well.         Disposition: PACU - hemodynamically stable.         Condition: stable

## 2022-09-13 NOTE — Anesthesia Procedure Notes (Signed)
Procedure Name: LMA Insertion Date/Time: 09/13/2022 1:56 PM  Performed by: Willa Frater, CRNAPre-anesthesia Checklist: Patient identified, Emergency Drugs available, Suction available and Patient being monitored Patient Re-evaluated:Patient Re-evaluated prior to induction Oxygen Delivery Method: Circle system utilized Preoxygenation: Pre-oxygenation with 100% oxygen Induction Type: IV induction Ventilation: Mask ventilation without difficulty LMA: LMA inserted LMA Size: 4.0 Number of attempts: 1 Airway Equipment and Method: Bite block Placement Confirmation: positive ETCO2 Tube secured with: Tape Dental Injury: Teeth and Oropharynx as per pre-operative assessment

## 2022-09-13 NOTE — Anesthesia Preprocedure Evaluation (Addendum)
Anesthesia Evaluation  Patient identified by MRN, date of birth, ID band Patient awake    Reviewed: Allergy & Precautions, NPO status , Patient's Chart, lab work & pertinent test results  Airway Mallampati: II  TM Distance: >3 FB Neck ROM: Full    Dental no notable dental hx. (+) Teeth Intact, Dental Advisory Given   Pulmonary    Pulmonary exam normal breath sounds clear to auscultation       Cardiovascular hypertension, Pt. on medications Normal cardiovascular exam Rhythm:Regular Rate:Normal  09/07/22 EKG SR R 80   Neuro/Psych negative neurological ROS  negative psych ROS   GI/Hepatic   Endo/Other  diabetes    Renal/GU Lab Results      Component                Value               Date                      CREATININE               0.92                09/07/2022               K                        4.5                 09/07/2022                     Musculoskeletal   Abdominal  (+) + obese (bmi 48.4)  Peds  Hematology   Anesthesia Other Findings All: PCN, Ampicillin  DCIS R Breast  Reproductive/Obstetrics                             Anesthesia Physical Anesthesia Plan  ASA: 3  Anesthesia Plan: General   Post-op Pain Management: Precedex, Tylenol PO (pre-op)* and Toradol IV (intra-op)*   Induction: Intravenous  PONV Risk Score and Plan: Midazolam, TIVA, Propofol infusion, Ondansetron and Treatment may vary due to age or medical condition  Airway Management Planned: LMA  Additional Equipment: None  Intra-op Plan:   Post-operative Plan: Extubation in OR  Informed Consent: I have reviewed the patients History and Physical, chart, labs and discussed the procedure including the risks, benefits and alternatives for the proposed anesthesia with the patient or authorized representative who has indicated his/her understanding and acceptance.     Dental advisory given  Plan  Discussed with:   Anesthesia Plan Comments: (TIVA LMA  On Dulaglutide last 2/25)        Anesthesia Quick Evaluation

## 2022-09-13 NOTE — Transfer of Care (Signed)
Immediate Anesthesia Transfer of Care Note  Patient: Tonya Calhoun  Procedure(s) Performed: RIGHT BREAST CENTRAL LUMPECTOMY (Right: Breast)  Patient Location: PACU  Anesthesia Type:General  Level of Consciousness: sedated  Airway & Oxygen Therapy: Patient Spontanous Breathing and Patient connected to face mask oxygen  Post-op Assessment: Report given to RN and Post -op Vital signs reviewed and stable  Post vital signs: Reviewed and stable  Last Vitals:  Vitals Value Taken Time  BP    Temp    Pulse    Resp    SpO2      Last Pain:  Vitals:   09/13/22 1113  TempSrc: Oral  PainSc: 0-No pain      Patients Stated Pain Goal: 3 ([id number])  Complications: No notable events documented.

## 2022-09-13 NOTE — Discharge Instructions (Addendum)
[place] Office Phone Number [phone]  BREAST BIOPSY/ PARTIAL MASTECTOMY: POST OP INSTRUCTIONS  Always review your discharge instruction sheet given to you by the facility where your surgery was performed.  IF YOU HAVE DISABILITY OR FAMILY LEAVE FORMS, YOU MUST BRING THEM TO THE OFFICE FOR PROCESSING.  DO NOT GIVE THEM TO YOUR DOCTOR.  Take 2 tylenol (acetominophen) three times a day for 3 days.  If you still have pain, add ibuprofen with food in between if able to take this (if you have kidney issues or stomach issues, do not take ibuprofen).  If both of those are not enough, add the narcotic pain pill.  If you find you are needing a lot of this overnight after surgery, call the next morning for a refill.    Prescriptions will not be filled after 5pm or on week-ends. Take your usually prescribed medications unless otherwise directed You should eat very light the first 24 hours after surgery, such as soup, crackers, pudding, etc.  Resume your normal diet the day after surgery. Most patients will experience some swelling and bruising in the breast.  Ice packs and a good support bra will help.  Swelling and bruising can take several days to resolve.  It is common to experience some constipation if taking pain medication after surgery.  Increasing fluid intake and taking a stool softener will usually help or prevent this problem from occurring.  A mild laxative (Milk of Magnesia or Miralax) should be taken according to package directions if there are no bowel movements after 48 hours. Unless discharge instructions indicate otherwise, you may remove your bandages 48 hours after surgery, and you may shower at that time.  You may have steri-strips (small skin tapes) in place directly over the incision.  These strips should be left on the skin at least for for 7-10 days.    ACTIVITIES:  You may resume regular daily activities (gradually increasing) beginning the next day.  Wearing a  good support bra or sports bra (or the breast binder) minimizes pain and swelling.  You may have sexual intercourse when it is comfortable. No heavy lifting for 1-2 weeks (not over around 10 pounds).  You may drive when you no longer are taking prescription pain medication, you can comfortably wear a seatbelt, and you can safely maneuver your car and apply brakes. RETURN TO WORK:  __________3-14 days depending on job. _______________ Dennis Bast should see your doctor in the office for a follow-up appointment approximately two weeks after your surgery.  Your doctor's nurse will typically make your follow-up appointment when she calls you with your pathology report.  Expect your pathology report 3-4 business days after your surgery.  You may call to check if you do not hear from [place] after three days.   WHEN TO CALL YOUR DOCTOR: Fever over 101.0 Nausea and/or vomiting. Extreme swelling or bruising. Continued bleeding from incision. Increased pain, redness, or drainage from the incision.  The clinic staff is available to answer your questions during regular business hours.  Please don't hesitate to call and ask to speak to one of the nurses for clinical concerns.  If you have a medical emergency, go to the nearest emergency room or call 911.  A surgeon from [facility] is always on call at the hospital.  For further questions, please visit [email or web address]     Post Anesthesia Home Care Instructions  Activity: Get plenty of rest for the remainder of the day. A responsible individual must  stay with you for 24 hours following the procedure.  For the next 24 hours, DO NOT: -Drive a car -Paediatric nurse -Drink alcoholic beverages -Take any medication unless instructed by your physician -Make any legal decisions or sign important papers.  Meals: Start with liquid foods such as gelatin or soup. Progress to regular foods as tolerated. Avoid greasy, spicy, heavy foods. If nausea  and/or vomiting occur, drink only clear liquids until the nausea and/or vomiting subsides. Call your physician if vomiting continues.  Special Instructions/Symptoms: Your throat may feel dry or sore from the anesthesia or the breathing tube placed in your throat during surgery. If this causes discomfort, gargle with warm salt water. The discomfort should disappear within 24 hours.  If you had a scopolamine patch placed behind your ear for the management of post- operative nausea and/or vomiting:  1. The medication in the patch is effective for 72 hours, after which it should be removed.  Wrap patch in a tissue and discard in the trash. Wash hands thoroughly with soap and water. 2. You may remove the patch earlier than 72 hours if you experience unpleasant side effects which may include dry mouth, dizziness or visual disturbances. 3. Avoid touching the patch. Wash your hands with soap and water after contact with the patch.  No tylenol until after 5:15pm today if needed.  No ibuprofen until after 8:15pm tonight if needed.

## 2022-09-14 ENCOUNTER — Encounter (HOSPITAL_BASED_OUTPATIENT_CLINIC_OR_DEPARTMENT_OTHER): Payer: Self-pay | Admitting: General Surgery

## 2022-09-14 NOTE — Anesthesia Postprocedure Evaluation (Signed)
Anesthesia Post Note  Patient: Tonya Calhoun  Procedure(s) Performed: RIGHT BREAST CENTRAL LUMPECTOMY (Right: Breast)     Patient location during evaluation: PACU Anesthesia Type: General Level of consciousness: awake and alert Pain management: pain level controlled Vital Signs Assessment: post-procedure vital signs reviewed and stable Respiratory status: spontaneous breathing, nonlabored ventilation, respiratory function stable and patient connected to nasal cannula oxygen Cardiovascular status: blood pressure returned to baseline and stable Postop Assessment: no apparent nausea or vomiting Anesthetic complications: no  No notable events documented.  Last Vitals:  Vitals:   09/13/22 1549 09/13/22 1630  BP:  122/78  Pulse: 88 91  Resp: 15 16  Temp:    SpO2: 96% 96%    Last Pain:  Vitals:   09/14/22 1030  TempSrc:   PainSc: 0-No pain                 Barnet Glasgow

## 2022-09-18 ENCOUNTER — Other Ambulatory Visit: Payer: 59

## 2022-09-18 LAB — SURGICAL PATHOLOGY

## 2022-09-20 ENCOUNTER — Telehealth: Payer: Self-pay | Admitting: Hematology and Oncology

## 2022-09-20 ENCOUNTER — Other Ambulatory Visit: Payer: Self-pay | Admitting: *Deleted

## 2022-09-20 DIAGNOSIS — D0511 Intraductal carcinoma in situ of right breast: Secondary | ICD-10-CM

## 2022-09-20 NOTE — Telephone Encounter (Signed)
scheduled per 3/13 referral, pt has been called and confirmed date and time. Pt is aware of location and to arrive early for check in

## 2022-09-21 NOTE — Progress Notes (Signed)
New Breast Cancer Diagnosis: Right Breast  Did patient present with symptoms (if so, please note symptoms) or screening mammography?: Patient noted nipple discharge around 1 year ago that was associated with crusting over the nipple.  She underwent a diagnostic mammogram/US and breast MR which were all negative.  She reported the discharged stopped but the nipple appearance kept worsening and now she notes crusting and scaling all the time.   Location and Extent of disease :right breast. Measured 1.5 cm and 1.6 in greatest dimension. Adenopathy no.   Histology per Pathology Report: grade 2, DCIS involving Nipple Epidermis 09/13/2022  Receptor Status: ER(negative), PR (negative), Her2-neu (positive), Ki-(%)   Surgeon and surgical plan, if any:  Dr. Barry Dienes - Right Breast Central Lumpectomy 09/13/2022   Medical oncologist, treatment if any:   Dr. Chryl Heck 10/03/2022   Family History of Breast/Ovarian/Prostate Cancer: Dad had prostate cancer, Maternal Grandmother had breast cancer.  Lymphedema issues, if any: No     Pain issues, if any: No     SAFETY ISSUES: Prior radiation? No Pacemaker/ICD? No Possible current pregnancy? Hysterectomy 2013 Is the patient on methotrexate? No  Current Complaints / other details:

## 2022-09-22 ENCOUNTER — Ambulatory Visit
Admission: RE | Admit: 2022-09-22 | Discharge: 2022-09-22 | Disposition: A | Discharge status: Home / Self Care | Payer: 59 | Source: Ambulatory Visit | Attending: Radiation Oncology | Admitting: Radiation Oncology

## 2022-09-22 ENCOUNTER — Encounter: Payer: Self-pay | Admitting: Radiation Oncology

## 2022-09-22 DIAGNOSIS — D0511 Intraductal carcinoma in situ of right breast: Secondary | ICD-10-CM

## 2022-09-22 NOTE — Progress Notes (Signed)
Radiation Oncology         [phone] ________________________________  Name: Tonya Calhoun        MRN: [id number]  Date of Service: 09/22/2022 DOB: 05/13/1966  [id number], Hulen Shouts, PA-C  Stark Klein, MD     REFERRING PHYSICIAN: Stark Klein, MD   DIAGNOSIS: The encounter diagnosis was Ductal carcinoma in situ (DCIS) of right breast.   HISTORY OF PRESENT ILLNESS: Tonya Calhoun is a 57 y.o. female seen at the request of Dr. Barry Dienes for Paget's disease of the right breast. Patient first noticed crusting over her nipple along with nipple discharge approximately one year ago. Diagnostic mammogram, U/S, and breast MRI were all negative at that time. The discharge stopped, but the nipple appearance kept worsening. Patient met with Dr. Barry Dienes and underwent a punch biopsy on 08/11/22 which revealed DCIS involving nipple epidermis. Prognostics stained ER/PR negative and was HER2+. Patient met again with Dr. Barry Dienes on 09/04/22 to discuss pathology findings. She recommended breast conserving surgery at this time. Patient underwent subsequent diagnostic mammogram and U/S on 09/11/22 which showed no concerning masses, calcifications, or nonsurgical distortions. No adenopathy was visualized. On physical examination scaliness and discoloration of the right nipple were noted, compatible with Paget's disease.   Patient underwent lumpectomy on 09/13/22 under the care of Dr. Barry Dienes. Pathology revealed grade 2 DCIS involving the nipple epidermis. Disease measured 1.5 cm and 1.6 cm in the greatest dimension. Prognostic markers were ER and PR negative. Today, she reports to be doing well since her surgery. She denies any breast pain, breast swelling, arm swelling, or issues with range of motion.   She had been kindly referred to [place] to discuss radiation treatment options. She is scheduled to meet with medical oncologist, Dr. Chryl Heck, on 10/03/22.    PREVIOUS RADIATION THERAPY: No   PAST MEDICAL HISTORY:   Past Medical History:  Diagnosis Date   Breast cancer in female [facility]) 08/2022   Diabetes mellitus    Diabetes mellitus without complication ([place])    Hypertension    Urticaria        PAST SURGICAL HISTORY: Past Surgical History:  Procedure Laterality Date   ANTERIOR AND POSTERIOR REPAIR  09/26/2011   Procedure: ANTERIOR (CYSTOCELE) AND POSTERIOR REPAIR (RECTOCELE);  Surgeon: Delice Lesch, MD;  Location: [facility];  Service: Gynecology;  Laterality: N/A;   BREAST LUMPECTOMY Right 09/13/2022   Procedure: RIGHT BREAST CENTRAL LUMPECTOMY;  Surgeon: Stark Klein, MD;  Location: [facility];  Service: General;  Laterality: Right;   BREAST SURGERY     reduction   COLONOSCOPY N/A 05/02/2019   Procedure: COLONOSCOPY;  Surgeon: Daneil Dolin, MD;  [facility];  Service: Endoscopy;  Laterality: N/A;  9:30   CYSTOSCOPY  09/26/2011   Procedure: CYSTOSCOPY;  Surgeon: Delice Lesch, MD;  Location: [facility];  Service: Gynecology;  Laterality: Bilateral;   REDUCTION MAMMAPLASTY Bilateral 2020   TUBAL LIGATION     VAGINAL HYSTERECTOMY  09/26/2011   Procedure: HYSTERECTOMY VAGINAL;  Surgeon: Delice Lesch, MD;  Location: [facility];  Service: Gynecology;  Laterality: N/A;  TOTAL VAGINAL HYSTERECTOMY; POSSIBLE ANTERIOR AND POSTERIOR REPAIR, CYSTOSCOPY     FAMILY HISTORY:  Family History  Problem Relation Age of Onset   Diabetes Mother    Hypertension Mother    Prostate cancer Father    Diabetes Father    Breast cancer Maternal Grandmother    Allergic rhinitis Neg Hx    Asthma Neg Hx  Eczema Neg Hx    Urticaria Neg Hx      SOCIAL HISTORY:  reports that she has never smoked. She has never used smokeless tobacco. She reports current alcohol use. She reports that she does not use drugs. 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  ALLERGIES: Penicillins, Ampicillin, and Penicillins   MEDICATIONS:  Current  Outpatient Medications  Medication Sig Dispense Refill   ALPRAZolam (XANAX) 0.25 MG tablet Take one tablet by mouth threes times daily 20 tablet 2   Blood Glucose Monitoring Suppl (ONETOUCH VERIO FLEX SYSTEM) w/Device KIT      clobetasol ointment (TEMOVATE) [id number] % Apply 1 application topically 2 (two) times daily as needed for dry skin.     doxycycline (ADOXA) 100 MG tablet Take 1 tablet (100 mg total) by mouth 2 (two) times daily with food for 7 days 14 tablet 0   Dulaglutide (TRULICITY) 3 0000000 SOPN Inject 0.5 ml (3 mg) under the skin every 7 days 2 mL 5   EPINEPHrine (EPIPEN 2-PAK) 0.3 mg/0.3 mL IJ SOAJ injection Inject 0.3 mLs (0.3 mg total) into the muscle as needed for anaphylaxis. 1 each 2   EPINEPHrine 0.3 mg/0.3 mL IJ SOAJ injection Inject 0.3 ml (1 pen) by intramuscular route once as directed for allergic reaction. 2 each 0   escitalopram (LEXAPRO) 5 MG tablet Take one tablet by mouth daily. 30 tablet 2   Ferrous Sulfate (IRON) 142 (45 Fe) MG TBCR Take by mouth daily.     lisinopril (PRINIVIL,ZESTRIL) 2.5 MG tablet 2.5 mg daily.      mupirocin ointment (BACTROBAN) 2 % Apply to the affected area of skin 3 times daily for 5 - 7 days 22 g 0   ONETOUCH VERIO test strip      oxyCODONE (OXY IR/ROXICODONE) 5 MG immediate release tablet Take 1 tablet (5 mg total) by mouth every 6 (six) hours as needed for severe pain. 5 tablet 0   TRULICITY 3 0000000 SOPN      Vitamin D, Cholecalciferol, 25 MCG (1000 UT) TABS Take by mouth daily.     No current facility-administered medications for this encounter.     REVIEW OF SYSTEMS: As per HPI     PHYSICAL EXAM:  Wt Readings from Last 3 Encounters:  09/13/22 240 lb 15.4 oz (109.3 kg)  12/25/19 229 lb (103.9 kg)  12/03/19 230 lb (104.3 kg)   Temp Readings from Last 3 Encounters:  09/13/22 (!) 97.1 F (36.2 C)  12/25/19 97.8 F (36.6 C) (Temporal)  05/02/19 98 F (36.7 C) (Oral)   BP Readings from Last 3 Encounters:  09/13/22 122/78   12/25/19 130/84  12/04/19 123/85   Pulse Readings from Last 3 Encounters:  09/13/22 91  12/25/19 100  12/04/19 97   Pain Assessment Pain Score: 0-No pain/10  Physical exam deferred, visit is a telephone visit.     ECOG = 1  0 - Asymptomatic (Fully active, able to carry on all predisease activities without restriction)  1 - Symptomatic but completely ambulatory (Restricted in physically strenuous activity but ambulatory and able to carry out work of a light or sedentary nature. For example, light housework, office work)  2 - Symptomatic, <50% in bed during the day (Ambulatory and capable of all self care but unable to carry out any work activities. Up and about more than 50% of waking hours)  3 - Symptomatic, >50% in bed, but not bedbound (Capable of  only limited self-care, confined to bed or chair 50% or more of waking hours)  4 - Bedbound (Completely disabled. Cannot carry on any self-care. Totally confined to bed or chair)  5 - Death   Eustace Pen MM, Creech RH, Tormey DC, et al. [phone]). "Toxicity and response criteria of the [facility]". Rafael Hernandez Oncol. 5 (6): 649-55    LABORATORY DATA:  Lab Results  Component Value Date   WBC 11.4 (H) 12/03/2019   HGB 11.7 (L) 12/03/2019   HCT 38.9 12/03/2019   MCV 97.3 12/03/2019   PLT 450 (H) 12/03/2019   Lab Results  Component Value Date   NA 134 (L) 09/07/2022   K 4.5 09/07/2022   CL 100 09/07/2022   CO2 25 09/07/2022   No results found for: "ALT", "AST", "GGT", "ALKPHOS", "BILITOT"    RADIOGRAPHY: MM DIAG BREAST TOMO UNI RIGHT  Result Date: 09/11/2022 CLINICAL DATA:  Patient with recent diagnosis of right breast Paget's disease. EXAM: DIGITAL DIAGNOSTIC UNILATERAL RIGHT MAMMOGRAM WITH TOMOSYNTHESIS; ULTRASOUND RIGHT BREAST LIMITED TECHNIQUE: Right digital diagnostic mammography and breast tomosynthesis was performed.; Targeted ultrasound examination of the right breast was performed COMPARISON:   Previous exam(s). ACR Breast Density Category a: The breasts are almost entirely fatty. FINDINGS: No concerning masses, calcifications or nonsurgical distortion identified within the retroareolar right breast. On physical exam, there is scaliness and discoloration of the right nipple compatible with history of Paget's. Targeted ultrasound is performed, showing no retroareolar mass. No right axillary adenopathy. IMPRESSION: Scaliness and discoloration of the right nipple compatible with recent diagnosis of right breast Paget's disease. RECOMMENDATION: Treatment plan for right breast Paget's disease. I have discussed the findings and recommendations with the patient. If applicable, a reminder letter will be sent to the patient regarding the next appointment. BI-RADS CATEGORY  6: Known biopsy-proven malignancy. Electronically Signed   By: Lovey Newcomer M.D.   On: 09/11/2022 17:02  US BREAST LTD UNI RIGHT INC AXILLA  Result Date: 09/11/2022 CLINICAL DATA:  Patient with recent diagnosis of right breast Paget's disease. EXAM: DIGITAL DIAGNOSTIC UNILATERAL RIGHT MAMMOGRAM WITH TOMOSYNTHESIS; ULTRASOUND RIGHT BREAST LIMITED TECHNIQUE: Right digital diagnostic mammography and breast tomosynthesis was performed.; Targeted ultrasound examination of the right breast was performed COMPARISON:  Previous exam(s). ACR Breast Density Category a: The breasts are almost entirely fatty. FINDINGS: No concerning masses, calcifications or nonsurgical distortion identified within the retroareolar right breast. On physical exam, there is scaliness and discoloration of the right nipple compatible with history of Paget's. Targeted ultrasound is performed, showing no retroareolar mass. No right axillary adenopathy. IMPRESSION: Scaliness and discoloration of the right nipple compatible with recent diagnosis of right breast Paget's disease. RECOMMENDATION: Treatment plan for right breast Paget's disease. I have discussed the findings and  recommendations with the patient. If applicable, a reminder letter will be sent to the patient regarding the next appointment. BI-RADS CATEGORY  6: Known biopsy-proven malignancy. Electronically Signed   By: Lovey Newcomer M.D.   On: 09/11/2022 17:02      IMPRESSION/PLAN:  This visit was conducted via telephone to spare the patient unnecessary potential exposure in the healthcare setting during the current COVID-19 pandemic.  1. 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 Today we reviewed her surgical pathology, discussed the nature of grade 2 DCIS of the nipple epidermis, and the role radiotherapy plays in treatment. Patient is a good candidate for  4 weeks of radiation to the right breast to decrease her risk of locoregional disease recurrence. We discussed the benefits, risks, and side effects to treatment. Side effects include, but are not limited to, skin irritation, fatigue, breast swelling, or breast pain. Long term effects that are much less common include lymphedema, lung scaring, rib fragility, and cardiac toxicity. Patient expressed understanding of the treatment and is ready to proceed with radiation therapy. All questions were answered.   We will wait to initiate treatment for at least 4 weeks following her lumpectomy to allow for complete wound healing.  Order for CT simulation to be scheduled has been placed. Visit was a telephone visit, and a consent form has not been signed yet. Patient knows to call back with any questions or concerns she has in the meantime.   The above documentation reflects my direct findings during this shared patient visit. Please see the separate note by Dr. Lisbeth Renshaw on this date for the remainder of the patient's plan of care.     Leona Singleton, PA  Given current concerns for patient exposure during the COVID-19 pandemic, this encounter was conducted  via telephone. The patient was notified in advance and was offered a [facility] meeting to allow for face to face communication but unfortunately reported that he/she did not have the appropriate resources/technology to support such a visit and instead preferred to proceed with telephone consult. The patient has given verbal consent for this type of encounter. The time spent during this encounter was 60 minutes. The attendants for this meeting include Kyung Rudd MD, Leona Singleton PA-C, and patient, Kayly Scholes During the encounter, Kyung Rudd MD and Leona Singleton PA-C  were located at [facility] Radiation Oncology Department.  Patient, Berkleigh Imholte, was in her vehicle.     **Disclaimer: This note was dictated with voice recognition software. Similar sounding words can inadvertently be transcribed and this note may contain transcription errors which may not have been corrected upon publication of note.**

## 2022-10-03 ENCOUNTER — Encounter: Payer: Self-pay | Admitting: Hematology and Oncology

## 2022-10-03 ENCOUNTER — Inpatient Hospital Stay: Payer: 59

## 2022-10-03 ENCOUNTER — Inpatient Hospital Stay: Payer: 59 | Attending: Hematology and Oncology | Admitting: Hematology and Oncology

## 2022-10-03 VITALS — BP 151/82 | HR 87 | Temp 97.8°F | Resp 18 | Ht 66.0 in | Wt 242.6 lb

## 2022-10-03 DIAGNOSIS — I10 Essential (primary) hypertension: Secondary | ICD-10-CM | POA: Insufficient documentation

## 2022-10-03 DIAGNOSIS — C44591 Other specified malignant neoplasm of skin of breast: Secondary | ICD-10-CM | POA: Insufficient documentation

## 2022-10-03 DIAGNOSIS — D0511 Intraductal carcinoma in situ of right breast: Secondary | ICD-10-CM | POA: Diagnosis not present

## 2022-10-03 DIAGNOSIS — Z171 Estrogen receptor negative status [ER-]: Secondary | ICD-10-CM | POA: Insufficient documentation

## 2022-10-03 DIAGNOSIS — E119 Type 2 diabetes mellitus without complications: Secondary | ICD-10-CM | POA: Diagnosis not present

## 2022-10-03 DIAGNOSIS — C50919 Malignant neoplasm of unspecified site of unspecified female breast: Secondary | ICD-10-CM | POA: Insufficient documentation

## 2022-10-03 NOTE — Progress Notes (Unsigned)
[facility] NOTE  Patient Care Team: Scherrie Bateman as PCP - General (Family Medicine) Danie Binder, MD (Inactive) as Consulting Physician (Gastroenterology) Hiram Comber, PA-C (Family Medicine)  CHIEF COMPLAINTS/PURPOSE OF CONSULTATION:  Newly diagnosed breast cancer  HISTORY OF PRESENTING ILLNESS:   Tedd Sias 57 y.o. female is here because of recent diagnosis of pagets disease of the nipple.  I reviewed her records extensively and collaborated the history with the patient.  SUMMARY OF ONCOLOGIC HISTORY: Oncology History   No history exists.   This is a very pleasant 57 year old female patient with newly diagnosed DCIS involving nipple epidermis/Paget's disease nuclear grade 2, DCIS measuring 1.5 cm in epidermis and 1.6 cm in the breast, ER/PR negative status postlumpectomy with negative margins referred to medical oncology for additional recommendations.  Patient has already followed up with radiation oncology and will be doing simulation early April.  She is recovering well as expected.  She does report some family history of breast cancer in maternal grandmother in her 39s but her maternal grandmother lived to her 51s.  She otherwise has well-controlled diabetes mellitus and hypertension.  No major medical issues.  Rest of the pertinent 10 point ROS reviewed and negative.  MEDICAL HISTORY:  Past Medical History:  Diagnosis Date   Breast cancer in female [facility]) 08/2022   Diabetes mellitus    Diabetes mellitus without complication ([place])    Hypertension    Urticaria     SURGICAL HISTORY: Past Surgical History:  Procedure Laterality Date   ANTERIOR AND POSTERIOR REPAIR  09/26/2011   Procedure: ANTERIOR (CYSTOCELE) AND POSTERIOR REPAIR (RECTOCELE);  Surgeon: Delice Lesch, MD;  Location: [facility];  Service: Gynecology;  Laterality: N/A;   BREAST LUMPECTOMY Right 09/13/2022   Procedure: RIGHT BREAST CENTRAL LUMPECTOMY;   Surgeon: Stark Klein, MD;  Location: [facility];  Service: General;  Laterality: Right;   BREAST SURGERY     reduction   COLONOSCOPY N/A 05/02/2019   Procedure: COLONOSCOPY;  Surgeon: Daneil Dolin, MD;  [facility];  Service: Endoscopy;  Laterality: N/A;  9:30   CYSTOSCOPY  09/26/2011   Procedure: CYSTOSCOPY;  Surgeon: Delice Lesch, MD;  Location: [facility];  Service: Gynecology;  Laterality: Bilateral;   REDUCTION MAMMAPLASTY Bilateral 2020   TUBAL LIGATION     VAGINAL HYSTERECTOMY  09/26/2011   Procedure: HYSTERECTOMY VAGINAL;  Surgeon: Delice Lesch, MD;  Location: [facility];  Service: Gynecology;  Laterality: N/A;  TOTAL VAGINAL HYSTERECTOMY; POSSIBLE ANTERIOR AND POSTERIOR REPAIR, CYSTOSCOPY    SOCIAL HISTORY: Social History   Socioeconomic History   Marital status: Married    Spouse name: Not on file   Number of children: Not on file   Years of education: Not on file   Highest education level: Not on file  Occupational History   Not on file  Tobacco Use   Smoking status: Never   Smokeless tobacco: Never  Vaping Use   Vaping Use: Never used  Substance and Sexual Activity   Alcohol use: Yes    Comment: occasionally   Drug use: Never   Sexual activity: Not on file  Other Topics Concern   Not on file  Social History Narrative   ** Merged History Encounter **       Social Determinants of Health   Financial Resource Strain: Not on file  Food Insecurity: Not on file  Transportation Needs: Not on file  Physical Activity: Not on  file  Stress: Not on file  Social Connections: Not on file  Intimate Partner Violence: Not on file    FAMILY HISTORY: Family History  Problem Relation Age of Onset   Diabetes Mother    Hypertension Mother    Prostate cancer Father    Diabetes Father    Breast cancer Maternal Grandmother    Allergic rhinitis Neg Hx    Asthma Neg Hx    Eczema Neg Hx    Urticaria Neg Hx     ALLERGIES:  is allergic to  penicillins, ampicillin, and penicillins.  MEDICATIONS:  Current Outpatient Medications  Medication Sig Dispense Refill   ALPRAZolam (XANAX) 0.25 MG tablet Take one tablet by mouth threes times daily 20 tablet 2   Blood Glucose Monitoring Suppl (ONETOUCH VERIO FLEX SYSTEM) w/Device KIT      clobetasol ointment (TEMOVATE) [id number] % Apply 1 application topically 2 (two) times daily as needed for dry skin.     doxycycline (ADOXA) 100 MG tablet Take 1 tablet (100 mg total) by mouth 2 (two) times daily with food for 7 days 14 tablet 0   Dulaglutide (TRULICITY) 3 0000000 SOPN Inject 0.5 ml (3 mg) under the skin every 7 days 2 mL 5   EPINEPHrine (EPIPEN 2-PAK) 0.3 mg/0.3 mL IJ SOAJ injection Inject 0.3 mLs (0.3 mg total) into the muscle as needed for anaphylaxis. 1 each 2   EPINEPHrine 0.3 mg/0.3 mL IJ SOAJ injection Inject 0.3 ml (1 pen) by intramuscular route once as directed for allergic reaction. 2 each 0   escitalopram (LEXAPRO) 5 MG tablet Take one tablet by mouth daily. 30 tablet 2   Ferrous Sulfate (IRON) 142 (45 Fe) MG TBCR Take by mouth daily.     lisinopril (PRINIVIL,ZESTRIL) 2.5 MG tablet 2.5 mg daily.      mupirocin ointment (BACTROBAN) 2 % Apply to the affected area of skin 3 times daily for 5 - 7 days 22 g 0   ONETOUCH VERIO test strip      oxyCODONE (OXY IR/ROXICODONE) 5 MG immediate release tablet Take 1 tablet (5 mg total) by mouth every 6 (six) hours as needed for severe pain. 5 tablet 0   TRULICITY 3 0000000 SOPN      Vitamin D, Cholecalciferol, 25 MCG (1000 UT) TABS Take by mouth daily.     No current facility-administered medications for this visit.    REVIEW OF SYSTEMS:   Constitutional: Denies fevers, chills or abnormal night sweats Eyes: Denies blurriness of vision, double vision or watery eyes Ears, nose, mouth, throat, and face: Denies mucositis or sore throat Respiratory: Denies cough, dyspnea or wheezes Cardiovascular: Denies palpitation, chest discomfort or lower  extremity swelling Gastrointestinal:  Denies nausea, heartburn or change in bowel habits Skin: Denies abnormal skin rashes Lymphatics: Denies new lymphadenopathy or easy bruising Neurological:Denies numbness, tingling or new weaknesses Behavioral/Psych: Mood is stable, no new changes  Breast: Denies any palpable lumps or discharge All other systems were reviewed with the patient and are negative.  PHYSICAL EXAMINATION: ECOG PERFORMANCE STATUS: 0 - Asymptomatic  Vitals:   10/03/22 1401  BP: (!) 151/82  Pulse: 87  Resp: 18  Temp: 97.8 F (36.6 C)  SpO2: 100%   Filed Weights   10/03/22 1401  Weight: 242 lb 9.6 oz (110 kg)    GENERAL:alert, no distress and comfortable Right breast surgical scar appears to be healing well.  Rest of the physical exam deferred in lieu of counseling  LABORATORY DATA:  I have reviewed  the data as listed Lab Results  Component Value Date   WBC 11.4 (H) 12/03/2019   HGB 11.7 (L) 12/03/2019   HCT 38.9 12/03/2019   MCV 97.3 12/03/2019   PLT 450 (H) 12/03/2019   Lab Results  Component Value Date   NA 134 (L) 09/07/2022   K 4.5 09/07/2022   CL 100 09/07/2022   CO2 25 09/07/2022    RADIOGRAPHIC STUDIES: I have personally reviewed the radiological reports and agreed with the findings in the report.  ASSESSMENT AND PLAN:   This is a very pleasant 57 year old perimenopausal woman status post partial hysterectomy referred to medical oncology given new diagnosis of pagetoid disease of the right nipple along with underlying DCIS status postlumpectomy, negative margins, ER/PR negative referred to medical oncology.  She will be seeing Dr. Genia Harold from radiation oncology for simulation next week.  We have discussed treatment recommendations and DCIS in general.  Recommendation: 1. Breast conserving surgery 2. Followed by adjuvant radiation therapy 3. Followed by antiestrogen therapy with tamoxifen/aromatase inhibitors based on menopausal status 5 years  based on ER/PR status  We have discussed no clear role of antiestrogen therapy in her case given negative ER/PR disease however some of them to consider taking antiestrogen therapy for breast cancer prevention.  She is not interested at this time in pursuing antiestrogen therapy.  We will see her as needed.  She will continue to follow-up with Dr. Barry Dienes for surveillance.  All questions were answered. The patient knows to call the clinic with any problems, questions or concerns.    Benay Pike, MD 10/03/22

## 2022-10-11 ENCOUNTER — Inpatient Hospital Stay: Payer: 59 | Attending: Hematology and Oncology | Admitting: Licensed Clinical Social Worker

## 2022-10-11 NOTE — Progress Notes (Signed)
[place] Work  Initial Assessment   Tonya Calhoun is a 57 y.o. year old female contacted by phone. Clinical Social Work was referred by new patient protocol for assessment of psychosocial needs.   SDOH (Social Determinants of Health) assessments performed: Yes SDOH Interventions    Flowsheet Row Clinical Support from 10/11/2022 in [place] at [facility]  SDOH Interventions   Food Insecurity Interventions Intervention Not Indicated  Housing Interventions Intervention Not Indicated  Transportation Interventions Intervention Not Indicated  Utilities Interventions Intervention Not Indicated       SDOH Screenings   Food Insecurity: No Food Insecurity (10/11/2022)  Housing: Low Risk  (10/11/2022)  Transportation Needs: No Transportation Needs (10/11/2022)  Utilities: Not At Risk (10/11/2022)  Tobacco Use: Low Risk  (10/03/2022)     Distress Screen completed: No     No data to display            Family/Social Information:  Housing Arrangement: patient lives with spouse Family members/support persons in your life? Family and Friends Transportation concerns: no  Employment: Working full time - has Event organiser.  Income source: Employment Financial concerns: No Type of concern: None Food access concerns: no Religious or spiritual practice: Yes-relying on faith to help cope  Services Currently in place:  FMLA, [place]  Coping/ Adjustment to diagnosis: Patient understands treatment plan and what happens next? yes, has had surgery and is getting ready for radiation Patient reported stressors:  adjusting to treatment, grieving deaths of mom and grandmother Current coping skills/ strengths: Ability for insight , Communication skills , and Supportive family/friends     SUMMARY: Current SDOH Barriers:  No major barriers noted today  Clinical Social Work Clinical Goal(s):  No clinical social work goals at this  time  Interventions: Discussed common feeling and emotions when being diagnosed with cancer, and the importance of support during treatment Informed patient of the support team roles and support services at [facility] Provided [place] contact information and encouraged patient to call with any questions or concerns   Follow Up Plan: Patient will contact CSW with any support or resource needs Patient verbalizes understanding of plan: Yes    Tonya Corona E Cinthia Rodden, LCSW

## 2022-10-18 ENCOUNTER — Other Ambulatory Visit: Payer: Self-pay

## 2022-10-18 ENCOUNTER — Ambulatory Visit
Admission: RE | Admit: 2022-10-18 | Discharge: 2022-10-18 | Disposition: A | Discharge status: Home / Self Care | Payer: 59 | Source: Ambulatory Visit | Attending: Radiation Oncology | Admitting: Radiation Oncology

## 2022-10-18 DIAGNOSIS — C50011 Malignant neoplasm of nipple and areola, right female breast: Secondary | ICD-10-CM | POA: Insufficient documentation

## 2022-10-18 DIAGNOSIS — Z171 Estrogen receptor negative status [ER-]: Secondary | ICD-10-CM | POA: Diagnosis not present

## 2022-10-18 DIAGNOSIS — C792 Secondary malignant neoplasm of skin: Secondary | ICD-10-CM | POA: Insufficient documentation

## 2022-10-18 DIAGNOSIS — Z510 Encounter for antineoplastic radiation therapy: Secondary | ICD-10-CM | POA: Diagnosis present

## 2022-10-20 ENCOUNTER — Telehealth: Payer: Self-pay

## 2022-10-20 NOTE — Telephone Encounter (Signed)
Notified Patient of completion of FMLA forms. Fax transmission confirmation received. Copy of forms e-mailed to Patient as requested. No other needs or concerns voiced at this time. 

## 2022-10-26 DIAGNOSIS — Z510 Encounter for antineoplastic radiation therapy: Secondary | ICD-10-CM | POA: Diagnosis not present

## 2022-11-02 ENCOUNTER — Other Ambulatory Visit: Payer: Self-pay

## 2022-11-02 ENCOUNTER — Ambulatory Visit
Admission: RE | Admit: 2022-11-02 | Discharge: 2022-11-02 | Disposition: A | Discharge status: Home / Self Care | Payer: 59 | Source: Ambulatory Visit | Attending: Radiation Oncology | Admitting: Radiation Oncology

## 2022-11-02 DIAGNOSIS — Z510 Encounter for antineoplastic radiation therapy: Secondary | ICD-10-CM | POA: Diagnosis not present

## 2022-11-02 LAB — RAD ONC ARIA SESSION SUMMARY
Course Elapsed Days: 0
Plan Fractions Treated to Date: 1
Plan Prescribed Dose Per Fraction: 2.66 Gy
Plan Total Fractions Prescribed: 16
Plan Total Prescribed Dose: 42.56 Gy
Reference Point Dosage Given to Date: 2.66 Gy
Reference Point Session Dosage Given: 2.66 Gy
Session Number: 1

## 2022-11-03 ENCOUNTER — Ambulatory Visit
Admission: RE | Admit: 2022-11-03 | Discharge: 2022-11-03 | Disposition: A | Discharge status: Home / Self Care | Payer: 59 | Source: Ambulatory Visit | Attending: Radiation Oncology | Admitting: Radiation Oncology

## 2022-11-03 ENCOUNTER — Other Ambulatory Visit: Payer: Self-pay

## 2022-11-03 DIAGNOSIS — Z510 Encounter for antineoplastic radiation therapy: Secondary | ICD-10-CM | POA: Diagnosis not present

## 2022-11-03 LAB — RAD ONC ARIA SESSION SUMMARY
Course Elapsed Days: 1
Plan Fractions Treated to Date: 2
Plan Prescribed Dose Per Fraction: 2.66 Gy
Plan Total Fractions Prescribed: 16
Plan Total Prescribed Dose: 42.56 Gy
Reference Point Dosage Given to Date: 5.32 Gy
Reference Point Session Dosage Given: 2.66 Gy
Session Number: 2

## 2022-11-06 ENCOUNTER — Ambulatory Visit
Admission: RE | Admit: 2022-11-06 | Discharge: 2022-11-06 | Disposition: A | Discharge status: Home / Self Care | Payer: 59 | Source: Ambulatory Visit | Attending: Radiation Oncology | Admitting: Radiation Oncology

## 2022-11-06 ENCOUNTER — Other Ambulatory Visit: Payer: Self-pay

## 2022-11-06 DIAGNOSIS — Z510 Encounter for antineoplastic radiation therapy: Secondary | ICD-10-CM | POA: Diagnosis not present

## 2022-11-06 LAB — RAD ONC ARIA SESSION SUMMARY
Course Elapsed Days: 4
Plan Fractions Treated to Date: 3
Plan Prescribed Dose Per Fraction: 2.66 Gy
Plan Total Fractions Prescribed: 16
Plan Total Prescribed Dose: 42.56 Gy
Reference Point Dosage Given to Date: 7.98 Gy
Reference Point Session Dosage Given: 2.66 Gy
Session Number: 3

## 2022-11-07 ENCOUNTER — Ambulatory Visit
Admission: RE | Admit: 2022-11-07 | Discharge: 2022-11-07 | Disposition: A | Discharge status: Home / Self Care | Payer: 59 | Source: Ambulatory Visit | Attending: Radiation Oncology | Admitting: Radiation Oncology

## 2022-11-07 ENCOUNTER — Other Ambulatory Visit: Payer: Self-pay

## 2022-11-07 DIAGNOSIS — Z510 Encounter for antineoplastic radiation therapy: Secondary | ICD-10-CM | POA: Diagnosis not present

## 2022-11-07 LAB — RAD ONC ARIA SESSION SUMMARY
Course Elapsed Days: 5
Plan Fractions Treated to Date: 4
Plan Prescribed Dose Per Fraction: 2.66 Gy
Plan Total Fractions Prescribed: 16
Plan Total Prescribed Dose: 42.56 Gy
Reference Point Dosage Given to Date: 10.64 Gy
Reference Point Session Dosage Given: 2.66 Gy
Session Number: 4

## 2022-11-08 ENCOUNTER — Ambulatory Visit
Admission: RE | Admit: 2022-11-08 | Discharge: 2022-11-08 | Disposition: A | Discharge status: Home / Self Care | Payer: 59 | Source: Ambulatory Visit | Attending: Radiation Oncology | Admitting: Radiation Oncology

## 2022-11-08 ENCOUNTER — Other Ambulatory Visit: Payer: Self-pay

## 2022-11-08 DIAGNOSIS — C50911 Malignant neoplasm of unspecified site of right female breast: Secondary | ICD-10-CM | POA: Diagnosis present

## 2022-11-08 LAB — RAD ONC ARIA SESSION SUMMARY
Course Elapsed Days: 6
Plan Fractions Treated to Date: 5
Plan Prescribed Dose Per Fraction: 2.66 Gy
Plan Total Fractions Prescribed: 16
Plan Total Prescribed Dose: 42.56 Gy
Reference Point Dosage Given to Date: 13.3 Gy
Reference Point Session Dosage Given: 2.66 Gy
Session Number: 5

## 2022-11-09 ENCOUNTER — Ambulatory Visit
Admission: RE | Admit: 2022-11-09 | Discharge: 2022-11-09 | Disposition: A | Discharge status: Home / Self Care | Payer: 59 | Source: Ambulatory Visit | Attending: Radiation Oncology | Admitting: Radiation Oncology

## 2022-11-09 ENCOUNTER — Other Ambulatory Visit: Payer: Self-pay

## 2022-11-09 DIAGNOSIS — C50911 Malignant neoplasm of unspecified site of right female breast: Secondary | ICD-10-CM

## 2022-11-09 LAB — RAD ONC ARIA SESSION SUMMARY
Course Elapsed Days: 7
Plan Fractions Treated to Date: 6
Plan Prescribed Dose Per Fraction: 2.66 Gy
Plan Total Fractions Prescribed: 16
Plan Total Prescribed Dose: 42.56 Gy
Reference Point Dosage Given to Date: 15.96 Gy
Reference Point Session Dosage Given: 2.66 Gy
Session Number: 6

## 2022-11-09 MED ORDER — RADIAPLEXRX EX GEL: Freq: Once | CUTANEOUS | Status: AC

## 2022-11-10 ENCOUNTER — Other Ambulatory Visit: Payer: Self-pay

## 2022-11-10 ENCOUNTER — Ambulatory Visit
Admission: RE | Admit: 2022-11-10 | Discharge: 2022-11-10 | Disposition: A | Discharge status: Home / Self Care | Payer: 59 | Source: Ambulatory Visit | Attending: Radiation Oncology | Admitting: Radiation Oncology

## 2022-11-10 DIAGNOSIS — C50911 Malignant neoplasm of unspecified site of right female breast: Secondary | ICD-10-CM | POA: Diagnosis not present

## 2022-11-10 LAB — RAD ONC ARIA SESSION SUMMARY
Course Elapsed Days: 8
Plan Fractions Treated to Date: 7
Plan Prescribed Dose Per Fraction: 2.66 Gy
Plan Total Fractions Prescribed: 16
Plan Total Prescribed Dose: 42.56 Gy
Reference Point Dosage Given to Date: 18.62 Gy
Reference Point Session Dosage Given: 2.66 Gy
Session Number: 7

## 2022-11-13 ENCOUNTER — Other Ambulatory Visit: Payer: Self-pay

## 2022-11-13 ENCOUNTER — Ambulatory Visit: Payer: 59

## 2022-11-13 ENCOUNTER — Ambulatory Visit
Admission: RE | Admit: 2022-11-13 | Discharge: 2022-11-13 | Disposition: A | Discharge status: Home / Self Care | Payer: 59 | Source: Ambulatory Visit | Attending: Radiation Oncology | Admitting: Radiation Oncology

## 2022-11-13 DIAGNOSIS — C50911 Malignant neoplasm of unspecified site of right female breast: Secondary | ICD-10-CM | POA: Diagnosis not present

## 2022-11-13 LAB — RAD ONC ARIA SESSION SUMMARY
Course Elapsed Days: 11
Plan Fractions Treated to Date: 8
Plan Prescribed Dose Per Fraction: 2.66 Gy
Plan Total Fractions Prescribed: 16
Plan Total Prescribed Dose: 42.56 Gy
Reference Point Dosage Given to Date: 21.28 Gy
Reference Point Session Dosage Given: 2.66 Gy
Session Number: 8

## 2022-11-14 ENCOUNTER — Ambulatory Visit
Admission: RE | Admit: 2022-11-14 | Discharge: 2022-11-14 | Disposition: A | Discharge status: Home / Self Care | Payer: 59 | Source: Ambulatory Visit | Attending: Radiation Oncology | Admitting: Radiation Oncology

## 2022-11-14 ENCOUNTER — Other Ambulatory Visit: Payer: Self-pay

## 2022-11-14 DIAGNOSIS — C50911 Malignant neoplasm of unspecified site of right female breast: Secondary | ICD-10-CM | POA: Diagnosis not present

## 2022-11-14 LAB — RAD ONC ARIA SESSION SUMMARY
Course Elapsed Days: 12
Plan Fractions Treated to Date: 9
Plan Prescribed Dose Per Fraction: 2.66 Gy
Plan Total Fractions Prescribed: 16
Plan Total Prescribed Dose: 42.56 Gy
Reference Point Dosage Given to Date: 23.94 Gy
Reference Point Session Dosage Given: 2.66 Gy
Session Number: 9

## 2022-11-15 ENCOUNTER — Ambulatory Visit
Admission: RE | Admit: 2022-11-15 | Discharge: 2022-11-15 | Disposition: A | Discharge status: Home / Self Care | Payer: 59 | Source: Ambulatory Visit | Attending: Radiation Oncology | Admitting: Radiation Oncology

## 2022-11-15 ENCOUNTER — Other Ambulatory Visit: Payer: Self-pay

## 2022-11-15 DIAGNOSIS — C50911 Malignant neoplasm of unspecified site of right female breast: Secondary | ICD-10-CM | POA: Diagnosis not present

## 2022-11-15 LAB — RAD ONC ARIA SESSION SUMMARY
Course Elapsed Days: 13
Plan Fractions Treated to Date: 10
Plan Prescribed Dose Per Fraction: 2.66 Gy
Plan Total Fractions Prescribed: 16
Plan Total Prescribed Dose: 42.56 Gy
Reference Point Dosage Given to Date: 26.6 Gy
Reference Point Session Dosage Given: 2.66 Gy
Session Number: 10

## 2022-11-16 ENCOUNTER — Other Ambulatory Visit: Payer: Self-pay

## 2022-11-16 ENCOUNTER — Ambulatory Visit
Admission: RE | Admit: 2022-11-16 | Discharge: 2022-11-16 | Disposition: A | Discharge status: Home / Self Care | Payer: 59 | Source: Ambulatory Visit | Attending: Radiation Oncology | Admitting: Radiation Oncology

## 2022-11-16 DIAGNOSIS — C50911 Malignant neoplasm of unspecified site of right female breast: Secondary | ICD-10-CM | POA: Diagnosis not present

## 2022-11-16 LAB — RAD ONC ARIA SESSION SUMMARY
Course Elapsed Days: 14
Plan Fractions Treated to Date: 11
Plan Prescribed Dose Per Fraction: 2.66 Gy
Plan Total Fractions Prescribed: 16
Plan Total Prescribed Dose: 42.56 Gy
Reference Point Dosage Given to Date: 29.26 Gy
Reference Point Session Dosage Given: 2.66 Gy
Session Number: 11

## 2022-11-17 ENCOUNTER — Ambulatory Visit
Admission: RE | Admit: 2022-11-17 | Discharge: 2022-11-17 | Disposition: A | Discharge status: Home / Self Care | Payer: 59 | Source: Ambulatory Visit | Attending: Radiation Oncology | Admitting: Radiation Oncology

## 2022-11-17 ENCOUNTER — Other Ambulatory Visit: Payer: Self-pay

## 2022-11-17 DIAGNOSIS — C50911 Malignant neoplasm of unspecified site of right female breast: Secondary | ICD-10-CM | POA: Diagnosis not present

## 2022-11-17 LAB — RAD ONC ARIA SESSION SUMMARY
Course Elapsed Days: 15
Plan Fractions Treated to Date: 12
Plan Prescribed Dose Per Fraction: 2.66 Gy
Plan Total Fractions Prescribed: 16
Plan Total Prescribed Dose: 42.56 Gy
Reference Point Dosage Given to Date: 31.92 Gy
Reference Point Session Dosage Given: 2.66 Gy
Session Number: 12

## 2022-11-20 ENCOUNTER — Ambulatory Visit
Admission: RE | Admit: 2022-11-20 | Discharge: 2022-11-20 | Disposition: A | Discharge status: Home / Self Care | Payer: 59 | Source: Ambulatory Visit | Attending: Radiation Oncology | Admitting: Radiation Oncology

## 2022-11-20 ENCOUNTER — Other Ambulatory Visit: Payer: Self-pay

## 2022-11-20 DIAGNOSIS — C50911 Malignant neoplasm of unspecified site of right female breast: Secondary | ICD-10-CM | POA: Diagnosis not present

## 2022-11-20 LAB — RAD ONC ARIA SESSION SUMMARY
Course Elapsed Days: 18
Plan Fractions Treated to Date: 13
Plan Prescribed Dose Per Fraction: 2.66 Gy
Plan Total Fractions Prescribed: 16
Plan Total Prescribed Dose: 42.56 Gy
Reference Point Dosage Given to Date: 34.58 Gy
Reference Point Session Dosage Given: 2.66 Gy
Session Number: 13

## 2022-11-21 ENCOUNTER — Ambulatory Visit
Admission: RE | Admit: 2022-11-21 | Discharge: 2022-11-21 | Disposition: A | Discharge status: Home / Self Care | Payer: 59 | Source: Ambulatory Visit | Attending: Radiation Oncology | Admitting: Radiation Oncology

## 2022-11-21 ENCOUNTER — Other Ambulatory Visit: Payer: Self-pay

## 2022-11-21 DIAGNOSIS — C50911 Malignant neoplasm of unspecified site of right female breast: Secondary | ICD-10-CM | POA: Diagnosis not present

## 2022-11-21 LAB — RAD ONC ARIA SESSION SUMMARY
Course Elapsed Days: 19
Plan Fractions Treated to Date: 14
Plan Prescribed Dose Per Fraction: 2.66 Gy
Plan Total Fractions Prescribed: 16
Plan Total Prescribed Dose: 42.56 Gy
Reference Point Dosage Given to Date: 37.24 Gy
Reference Point Session Dosage Given: 2.66 Gy
Session Number: 14

## 2022-11-22 ENCOUNTER — Ambulatory Visit
Admission: RE | Admit: 2022-11-22 | Discharge: 2022-11-22 | Disposition: A | Discharge status: Home / Self Care | Payer: 59 | Source: Ambulatory Visit | Attending: Radiation Oncology | Admitting: Radiation Oncology

## 2022-11-22 ENCOUNTER — Other Ambulatory Visit: Payer: Self-pay

## 2022-11-22 DIAGNOSIS — C50911 Malignant neoplasm of unspecified site of right female breast: Secondary | ICD-10-CM | POA: Diagnosis not present

## 2022-11-22 LAB — RAD ONC ARIA SESSION SUMMARY
Course Elapsed Days: 20
Plan Fractions Treated to Date: 15
Plan Prescribed Dose Per Fraction: 2.66 Gy
Plan Total Fractions Prescribed: 16
Plan Total Prescribed Dose: 42.56 Gy
Reference Point Dosage Given to Date: 39.9 Gy
Reference Point Session Dosage Given: 2.66 Gy
Session Number: 15

## 2022-11-23 ENCOUNTER — Ambulatory Visit: Payer: 59

## 2022-11-23 ENCOUNTER — Other Ambulatory Visit: Payer: Self-pay

## 2022-11-23 ENCOUNTER — Ambulatory Visit
Admission: RE | Admit: 2022-11-23 | Discharge: 2022-11-23 | Disposition: A | Discharge status: Home / Self Care | Payer: 59 | Source: Ambulatory Visit | Attending: Radiation Oncology | Admitting: Radiation Oncology

## 2022-11-23 DIAGNOSIS — C50911 Malignant neoplasm of unspecified site of right female breast: Secondary | ICD-10-CM | POA: Diagnosis not present

## 2022-11-23 LAB — RAD ONC ARIA SESSION SUMMARY
Course Elapsed Days: 21
Plan Fractions Treated to Date: 16
Plan Prescribed Dose Per Fraction: 2.66 Gy
Plan Total Fractions Prescribed: 16
Plan Total Prescribed Dose: 42.56 Gy
Reference Point Dosage Given to Date: 42.56 Gy
Reference Point Session Dosage Given: 2.66 Gy
Session Number: 16

## 2022-11-24 ENCOUNTER — Other Ambulatory Visit: Payer: Self-pay

## 2022-11-24 ENCOUNTER — Ambulatory Visit
Admission: RE | Admit: 2022-11-24 | Discharge: 2022-11-24 | Disposition: A | Discharge status: Home / Self Care | Payer: 59 | Source: Ambulatory Visit | Attending: Radiation Oncology | Admitting: Radiation Oncology

## 2022-11-24 DIAGNOSIS — C50911 Malignant neoplasm of unspecified site of right female breast: Secondary | ICD-10-CM | POA: Diagnosis not present

## 2022-11-24 LAB — RAD ONC ARIA SESSION SUMMARY
Course Elapsed Days: 22
Plan Fractions Treated to Date: 1
Plan Prescribed Dose Per Fraction: 2 Gy
Plan Total Fractions Prescribed: 4
Plan Total Prescribed Dose: 8 Gy
Reference Point Dosage Given to Date: 2 Gy
Reference Point Session Dosage Given: 2 Gy
Session Number: 17

## 2022-11-27 ENCOUNTER — Other Ambulatory Visit: Payer: Self-pay

## 2022-11-27 ENCOUNTER — Ambulatory Visit
Admission: RE | Admit: 2022-11-27 | Discharge: 2022-11-27 | Disposition: A | Discharge status: Home / Self Care | Payer: 59 | Source: Ambulatory Visit | Attending: Radiation Oncology | Admitting: Radiation Oncology

## 2022-11-27 DIAGNOSIS — C50911 Malignant neoplasm of unspecified site of right female breast: Secondary | ICD-10-CM | POA: Diagnosis not present

## 2022-11-27 LAB — RAD ONC ARIA SESSION SUMMARY
Course Elapsed Days: 25
Plan Fractions Treated to Date: 2
Plan Prescribed Dose Per Fraction: 2 Gy
Plan Total Fractions Prescribed: 4
Plan Total Prescribed Dose: 8 Gy
Reference Point Dosage Given to Date: 4 Gy
Reference Point Session Dosage Given: 2 Gy
Session Number: 18

## 2022-11-28 ENCOUNTER — Other Ambulatory Visit: Payer: Self-pay

## 2022-11-28 ENCOUNTER — Ambulatory Visit
Admission: RE | Admit: 2022-11-28 | Discharge: 2022-11-28 | Disposition: A | Discharge status: Home / Self Care | Payer: 59 | Source: Ambulatory Visit | Attending: Radiation Oncology | Admitting: Radiation Oncology

## 2022-11-28 DIAGNOSIS — C50911 Malignant neoplasm of unspecified site of right female breast: Secondary | ICD-10-CM | POA: Diagnosis not present

## 2022-11-28 LAB — RAD ONC ARIA SESSION SUMMARY
Course Elapsed Days: 26
Plan Fractions Treated to Date: 3
Plan Prescribed Dose Per Fraction: 2 Gy
Plan Total Fractions Prescribed: 4
Plan Total Prescribed Dose: 8 Gy
Reference Point Dosage Given to Date: 6 Gy
Reference Point Session Dosage Given: 2 Gy
Session Number: 19

## 2022-11-29 ENCOUNTER — Other Ambulatory Visit: Payer: Self-pay

## 2022-11-29 ENCOUNTER — Ambulatory Visit
Admission: RE | Admit: 2022-11-29 | Discharge: 2022-11-29 | Disposition: A | Discharge status: Home / Self Care | Payer: 59 | Source: Ambulatory Visit | Attending: Radiation Oncology | Admitting: Radiation Oncology

## 2022-11-29 DIAGNOSIS — C50911 Malignant neoplasm of unspecified site of right female breast: Secondary | ICD-10-CM | POA: Diagnosis not present

## 2022-11-29 LAB — RAD ONC ARIA SESSION SUMMARY
Course Elapsed Days: 27
Plan Fractions Treated to Date: 4
Plan Prescribed Dose Per Fraction: 2 Gy
Plan Total Fractions Prescribed: 4
Plan Total Prescribed Dose: 8 Gy
Reference Point Dosage Given to Date: 8 Gy
Reference Point Session Dosage Given: 2 Gy
Session Number: 20

## 2022-12-12 NOTE — Radiation Completion Notes (Addendum)
  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   Intent: Curative     ==========DELIVERED PLANS==========  First Treatment Date: 2022-11-02 - Last Treatment Date: 2022-11-29   Plan Name: Breast_R Site: Breast, Right Technique: 3D Mode: Photon Dose Per Fraction: 2.66 Gy Prescribed Dose (Delivered / Prescribed): 42.56 Gy / 42.56 Gy Prescribed Fxs (Delivered / Prescribed): 16 / 16   Plan Name: Breast_R_Bst Site: Breast, Right Technique: Electron Mode: Electron Dose Per Fraction: 2 Gy Prescribed Dose (Delivered / Prescribed): 8 Gy / 8 Gy Prescribed Fxs (Delivered / Prescribed): 4 / 4     ==========ON TREATMENT VISIT DATES========== 2022-11-03, 2022-11-09, 2022-11-17, 2022-11-24    See weekly On Treatment Notes in Epic for details. The patient tolerated radiation. She developed fatigue and anticipated skin changes in the treatment field.   The patient will receive a call in about one month from the radiation oncology department. She will continue follow up with Dr. Al Pimple as well.      Osker Mason, PAC

## 2023-02-05 ENCOUNTER — Ambulatory Visit
Admission: RE | Admit: 2023-02-05 | Discharge: 2023-02-05 | Disposition: A | Discharge status: Home / Self Care | Payer: 59 | Source: Ambulatory Visit | Attending: Radiation Oncology | Admitting: Radiation Oncology

## 2023-02-05 DIAGNOSIS — C50911 Malignant neoplasm of unspecified site of right female breast: Secondary | ICD-10-CM | POA: Insufficient documentation

## 2023-02-05 NOTE — Progress Notes (Signed)
  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 (as documented in provider EOT note)   The patient was available for call today.   Symptoms of fatigue have improved since completing therapy.  Symptoms of skin changes have improved since completing therapy.  The patient was encouraged to avoid sun exposure in the area of prior treatment for up to one year following radiation with either sunscreen or by the style of clothing worn in the sun.  The patient has scheduled follow up with her medical oncologist Dr. Al Pimple for ongoing surveillance, and was encouraged to call if she develops concerns or questions regarding radiation.  This concludes the interaction.  Ruel Favors, LPN

## 2023-03-11 ENCOUNTER — Ambulatory Visit
Admission: EM | Admit: 2023-03-11 | Discharge: 2023-03-11 | Disposition: A | Discharge status: Home / Self Care | Payer: 59 | Attending: Nurse Practitioner | Admitting: Nurse Practitioner

## 2023-03-11 DIAGNOSIS — R109 Unspecified abdominal pain: Secondary | ICD-10-CM

## 2023-03-11 LAB — POCT URINALYSIS DIP (MANUAL ENTRY)
Bilirubin, UA: NEGATIVE
Blood, UA: NEGATIVE
Glucose, UA: NEGATIVE mg/dL
Ketones, POC UA: NEGATIVE mg/dL
Leukocytes, UA: NEGATIVE
Nitrite, UA: NEGATIVE
Protein Ur, POC: NEGATIVE mg/dL
Spec Grav, UA: >=1.03 — AB (ref 1.010–1.025)
Urobilinogen, UA: 0.2 U/dL
pH, UA: 5.5 (ref 5.0–8.0)

## 2023-03-11 MED ORDER — TAMSULOSIN HCL 0.4 MG PO CAPS: 0.4000 mg | ORAL_CAPSULE | Freq: Every day | ORAL | 0 refills | Status: DC

## 2023-03-11 MED ORDER — IBUPROFEN 800 MG PO TABS: 800.0000 mg | ORAL_TABLET | Freq: Three times a day (TID) | ORAL | 0 refills | Status: DC

## 2023-03-11 MED ORDER — KETOROLAC TROMETHAMINE 30 MG/ML IJ SOLN
30.0000 mg | Freq: Once | INTRAMUSCULAR | Status: AC
  Administered 2023-03-11: 30 mg via INTRAMUSCULAR

## 2023-03-11 NOTE — Discharge Instructions (Addendum)
You were given an injection of Toradol 30 mg in the clinic. Do not take any additional ibuprofen, Aleve, or naproxen today.  If you have breakthrough pain, recommend Tylenol arthritis strength 650 mg tablet every 8 hours as needed. Make sure you are drinking at least 8-10 8 ounce glasses of water daily. Urine strainer has been to determine if you passed May apply warm compresses to the affected area to help with pain or discomfort. If you develop worsening pain, it is recommended that you go to the emergency department for further evaluation. Follow-up as needed.

## 2023-03-11 NOTE — ED Provider Notes (Signed)
[facility]    CSN: [id number] Arrival date & time: 03/11/23  1301      History   Chief Complaint Chief Complaint  Patient presents with   Back Pain    HPI Tonya Calhoun is a 57 y.o. female.   The history is provided by the patient.   Patient presents for complaints of left-sided flank pain that is been present for the past 4 days.  Patient describes the pain as "a dull ache" that comes and goes.  She states that she can feel the pain more when she is standing and sitting still.  Denies fever, chills, chest pain, abdominal pain, vomiting, diarrhea, urinary frequency, urgency, hesitancy, or hematuria.  Patient states that she did have an episode of nausea this morning.  States that she has been taking ibuprofen with no relief.  Denies prior history of kidney stones or pyelonephritis.  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) 10/03/2022   Adverse food reaction 12/25/2019   S/P vaginal hysterectomy 09/26/11 11/06/2011    Past Surgical History:  Procedure Laterality Date   ANTERIOR AND POSTERIOR REPAIR  09/26/2011   Procedure: ANTERIOR (CYSTOCELE) AND POSTERIOR REPAIR (RECTOCELE);  Surgeon: Purcell Nails, MD;  Location: [facility];  Service: Gynecology;  Laterality: N/A;   BREAST LUMPECTOMY Right 09/13/2022   Procedure: RIGHT BREAST CENTRAL LUMPECTOMY;  Surgeon: Almond Lint, MD;  Location: [facility];  Service: General;  Laterality: Right;   BREAST SURGERY     reduction   COLONOSCOPY N/A 05/02/2019   Procedure: COLONOSCOPY;  Surgeon: Corbin Ade, MD;  Location: [facility];  Service: Endoscopy;  Laterality: N/A;  9:30   CYSTOSCOPY  09/26/2011   Procedure: CYSTOSCOPY;  Surgeon: Purcell Nails, MD;  Location: [facility];  Service: Gynecology;  Laterality: Bilateral;   REDUCTION MAMMAPLASTY Bilateral 2020   TUBAL LIGATION     VAGINAL HYSTERECTOMY  09/26/2011   Procedure: HYSTERECTOMY VAGINAL;  Surgeon: Purcell Nails, MD;  Location: [facility];  Service: Gynecology;  Laterality: N/A;  TOTAL VAGINAL HYSTERECTOMY; POSSIBLE ANTERIOR AND POSTERIOR REPAIR, CYSTOSCOPY    OB History   No obstetric history on file.      Home Medications    Prior to Admission medications   Medication Sig Start Date End Date Taking? Authorizing Provider  ibuprofen (ADVIL) 800 MG tablet Take 1 tablet (800 mg total) by mouth 3 (three) times daily. 03/11/23  Yes Natsuko Kelsay-Warren, Sadie Haber, NP  tamsulosin (FLOMAX) 0.4 MG CAPS capsule Take 1 capsule (0.4 mg total) by mouth daily after supper. 03/11/23  Yes Persephonie Hegwood-Warren, Sadie Haber, NP  ALPRAZolam Prudy Feeler) 0.25 MG tablet Take one tablet by mouth threes times daily 11/25/20     Blood Glucose Monitoring Suppl (ONETOUCH VERIO FLEX SYSTEM) w/Device KIT  02/12/17   [provider]  clobetasol ointment (TEMOVATE) 0.05 % Apply 1 application topically 2 (two) times daily as needed for dry skin. 01/08/19   [provider]  doxycycline (ADOXA) 100 MG tablet Take 1 tablet (100 mg total) by mouth 2 (two) times daily with food for 7 days 01/11/21     Dulaglutide (TRULICITY) 3 MG/0.5ML SOPN Inject 0.5 ml (3 mg) under the skin  every 7 days 11/25/20     EPINEPHrine (EPIPEN 2-PAK) 0.3 mg/0.3 mL IJ SOAJ injection Inject 0.3 mLs (0.3 mg total) into the muscle as needed for anaphylaxis. 12/04/19   Gilda Crease, MD  EPINEPHrine 0.3 mg/0.3 mL IJ SOAJ injection Inject 0.3 ml (1 pen) by intramuscular route once as directed for allergic reaction. 12/16/20     escitalopram (LEXAPRO) 5 MG tablet Take one tablet by mouth daily. 11/25/20     Ferrous Sulfate (IRON) 142 (45 Fe) MG TBCR Take by mouth daily.    [provider]  lisinopril (PRINIVIL,ZESTRIL) 2.5 MG tablet  2.5 mg daily.  01/12/17   [provider]  mupirocin ointment (BACTROBAN) 2 % Apply to the affected area of skin 3 times daily for 5 - 7 days 01/11/21     [facility] VERIO test strip  03/11/17   [provider]  oxyCODONE (OXY IR/ROXICODONE) 5 MG immediate release tablet Take 1 tablet (5 mg total) by mouth every 6 (six) hours as needed for severe pain. 09/13/22   Almond Lint, MD  TRULICITY 3 MG/0.5ML [facility]  11/20/19   [provider]  Vitamin D, Cholecalciferol, 25 MCG (1000 UT) TABS Take by mouth daily.    [provider]    Family History Family History  Problem Relation Age of Onset   Diabetes Mother    Hypertension Mother    Prostate cancer Father    Diabetes Father    Breast cancer Maternal Grandmother    Allergic rhinitis Neg Hx    Asthma Neg Hx    Eczema Neg Hx    Urticaria Neg Hx     Social History Social History   Tobacco Use   Smoking status: Never   Smokeless tobacco: Never  Vaping Use   Vaping status: Never Used  Substance Use Topics   Alcohol use: Yes    Comment: occasionally   Drug use: Never     Allergies   Penicillins, Ampicillin, and Penicillins   Review of Systems Review of Systems Per HPI  Physical Exam Triage Vital Signs ED Triage Vitals  Encounter Vitals Group     BP 03/11/23 1349 (!) 143/95     Systolic BP Percentile --      Diastolic BP Percentile --      Pulse Rate 03/11/23 1349 (!) 104     Resp 03/11/23 1349 18     Temp 03/11/23 1349 98.1 F (36.7 C)     Temp Source 03/11/23 1349 Oral     SpO2 03/11/23 1349 94 %     Weight --      Height --      Head Circumference --      Peak Flow --      Pain Score 03/11/23 1348 5     Pain Loc --      Pain Education --      Exclude from Growth Chart --    No data found.  Updated Vital Signs BP (!) 143/95 (BP Location: Right Arm)   Pulse (!) 104   Temp 98.1 F (36.7 C) (Oral)   Resp 18   LMP 08/28/2011   SpO2 94%   Visual Acuity Right Eye Distance:    Left Eye Distance:   Bilateral Distance:    Right Eye Near:   Left Eye Near:    Bilateral Near:     Physical Exam Vitals and nursing note reviewed.  Constitutional:      General: She is not in  acute distress.    Appearance: Normal appearance.  HENT:     Head: Normocephalic.  Eyes:     Extraocular Movements: Extraocular movements intact.     Pupils: Pupils are equal, round, and reactive to light.  Cardiovascular:     Rate and Rhythm: Regular rhythm. Tachycardia present.     Pulses: Normal pulses.     Heart sounds: Normal heart sounds.  Pulmonary:     Effort: Pulmonary effort is normal. No respiratory distress.     Breath sounds: Normal breath sounds. No stridor. No wheezing, rhonchi or rales.  Abdominal:     General: Bowel sounds are normal.     Palpations: Abdomen is soft.     Tenderness: There is left CVA tenderness.  Musculoskeletal:     Cervical back: Normal range of motion.  Skin:    General: Skin is warm and dry.  Neurological:     General: No focal deficit present.     Mental Status: She is alert and oriented to person, place, and time.  Psychiatric:        Mood and Affect: Mood normal.        Behavior: Behavior normal.      UC Treatments / Results  Labs (all labs ordered are listed, but only abnormal results are displayed) Labs Reviewed  POCT URINALYSIS DIP (MANUAL ENTRY) - Abnormal; Notable for the following components:      Result Value   Spec Grav, UA >=1.030 (*)    All other components within normal limits    EKG   Radiology No results found.  Procedures Procedures (including critical care time)  Medications Ordered in UC Medications  ketorolac (TORADOL) 30 MG/ML injection 30 mg (30 mg Intramuscular Given 03/11/23 1430)    Initial Impression / Assessment and Plan / UC Course  I have reviewed the triage vital signs and the nursing notes.  Pertinent labs & imaging results that were available during my care of the patient were reviewed by  me and considered in my medical decision making (see chart for details).  The patient is well-appearing, she is in no acute distress, vital signs are stable.  On exam, patient has left CVA tenderness, and pain is located around the left flank.  Urinalysis is negative, other than elevated specific gravity.  Based on the patient's history, cannot rule out a kidney stone, however, no obvious findings on her urinalysis.  Toradol 30 mg IM administered for left flank pain.  Ibuprofen 800 mg prescribed for pain, tamsulosin 0.4 mg prescribed to help improve urinary flow in the event that this is a kidney stone, and a urine strainer was provided.  Supportive care recommendations were provided and discussed with the patient to include increasing her fluid intake and, drinking at least 10-12 8 ounce glasses of water daily.  Patient advised to follow-up in the emergency department if she develops worsening left flank pain for further evaluation.  Patient was in agreement with this plan of care and verbalizes understanding.  All questions were answered.  Patient stable for discharge. Final Clinical Impressions(s) / UC Diagnoses   Final diagnoses:  Left flank pain     Discharge Instructions      You were given an injection of Toradol 30 mg in the clinic. Do not take any additional ibuprofen, Aleve, or naproxen today.  If you have breakthrough pain, recommend Tylenol arthritis strength 650 mg tablet every 8 hours as needed. Make sure you are drinking at least 8-10 8 ounce glasses  of water daily. Urine strainer has been to determine if you passed May apply warm compresses to the affected area to help with pain or discomfort. If you develop worsening pain, it is recommended that you go to the emergency department for further evaluation. Follow-up as needed.     ED Prescriptions     Medication Sig Dispense Auth. Provider   ibuprofen (ADVIL) 800 MG tablet Take 1 tablet (800 mg total) by mouth 3 (three)  times daily. 30 tablet Deloros Beretta-Warren, Sadie Haber, NP   tamsulosin (FLOMAX) 0.4 MG CAPS capsule Take 1 capsule (0.4 mg total) by mouth daily after supper. 30 capsule Darla Mcdonald-Warren, Sadie Haber, NP      PDMP not reviewed this encounter.   Abran Cantor, NP 03/11/23 1445

## 2023-03-11 NOTE — ED Triage Notes (Signed)
Pt reports having pain to left lower back side (dull aching when standing/ sitting still). Pt denies urinary issues but states she did have nausea this morning.   Started: Wednesday   Home interventions: ibuprofen (not helpful)

## 2023-03-16 ENCOUNTER — Other Ambulatory Visit (HOSPITAL_COMMUNITY): Payer: Self-pay | Admitting: Family Medicine

## 2023-03-16 DIAGNOSIS — R109 Unspecified abdominal pain: Secondary | ICD-10-CM

## 2023-03-19 ENCOUNTER — Encounter (HOSPITAL_BASED_OUTPATIENT_CLINIC_OR_DEPARTMENT_OTHER): Payer: Self-pay

## 2023-03-19 ENCOUNTER — Ambulatory Visit (HOSPITAL_BASED_OUTPATIENT_CLINIC_OR_DEPARTMENT_OTHER): Admission: RE | Admit: 2023-03-19 | Payer: 59 | Source: Ambulatory Visit

## 2023-03-19 ENCOUNTER — Encounter (HOSPITAL_BASED_OUTPATIENT_CLINIC_OR_DEPARTMENT_OTHER): Payer: Self-pay | Admitting: Emergency Medicine

## 2023-03-19 ENCOUNTER — Inpatient Hospital Stay (HOSPITAL_BASED_OUTPATIENT_CLINIC_OR_DEPARTMENT_OTHER)
Admission: EM | Admit: 2023-03-19 | Discharge: 2023-03-24 | DRG: 316 | Disposition: A | Discharge status: Home / Self Care | Payer: 59 | Attending: Internal Medicine | Admitting: Internal Medicine

## 2023-03-19 ENCOUNTER — Other Ambulatory Visit: Payer: Self-pay

## 2023-03-19 ENCOUNTER — Emergency Department (HOSPITAL_BASED_OUTPATIENT_CLINIC_OR_DEPARTMENT_OTHER): Payer: 59

## 2023-03-19 DIAGNOSIS — Z853 Personal history of malignant neoplasm of breast: Secondary | ICD-10-CM

## 2023-03-19 DIAGNOSIS — Z79899 Other long term (current) drug therapy: Secondary | ICD-10-CM

## 2023-03-19 DIAGNOSIS — E669 Obesity, unspecified: Secondary | ICD-10-CM | POA: Diagnosis present

## 2023-03-19 DIAGNOSIS — I3139 Other pericardial effusion (noninflammatory): Secondary | ICD-10-CM | POA: Diagnosis not present

## 2023-03-19 DIAGNOSIS — Z923 Personal history of irradiation: Secondary | ICD-10-CM

## 2023-03-19 DIAGNOSIS — D649 Anemia, unspecified: Secondary | ICD-10-CM | POA: Diagnosis present

## 2023-03-19 DIAGNOSIS — E119 Type 2 diabetes mellitus without complications: Secondary | ICD-10-CM | POA: Diagnosis present

## 2023-03-19 DIAGNOSIS — Z7985 Long-term (current) use of injectable non-insulin antidiabetic drugs: Secondary | ICD-10-CM

## 2023-03-19 DIAGNOSIS — Z880 Allergy status to penicillin: Secondary | ICD-10-CM

## 2023-03-19 DIAGNOSIS — Z23 Encounter for immunization: Secondary | ICD-10-CM

## 2023-03-19 DIAGNOSIS — Z8249 Family history of ischemic heart disease and other diseases of the circulatory system: Secondary | ICD-10-CM

## 2023-03-19 DIAGNOSIS — Z791 Long term (current) use of non-steroidal anti-inflammatories (NSAID): Secondary | ICD-10-CM

## 2023-03-19 DIAGNOSIS — Z803 Family history of malignant neoplasm of breast: Secondary | ICD-10-CM

## 2023-03-19 DIAGNOSIS — I314 Cardiac tamponade: Secondary | ICD-10-CM | POA: Diagnosis present

## 2023-03-19 DIAGNOSIS — J9811 Atelectasis: Secondary | ICD-10-CM | POA: Diagnosis present

## 2023-03-19 DIAGNOSIS — I10 Essential (primary) hypertension: Secondary | ICD-10-CM | POA: Diagnosis present

## 2023-03-19 DIAGNOSIS — Z833 Family history of diabetes mellitus: Secondary | ICD-10-CM

## 2023-03-19 DIAGNOSIS — Z6839 Body mass index (BMI) 39.0-39.9, adult: Secondary | ICD-10-CM

## 2023-03-19 LAB — CBC
HCT: 33.1 % — ABNORMAL LOW (ref 36.0–46.0)
Hemoglobin: 10.5 g/dL — ABNORMAL LOW (ref 12.0–15.0)
MCH: 29.2 pg (ref 26.0–34.0)
MCHC: 31.7 g/dL (ref 30.0–36.0)
MCV: 92.2 fL (ref 80.0–100.0)
Platelets: 462 10*3/uL — ABNORMAL HIGH (ref 150–400)
RBC: 3.59 MIL/uL — ABNORMAL LOW (ref 3.87–5.11)
RDW: 13.2 % (ref 11.5–15.5)
WBC: 6.8 10*3/uL (ref 4.0–10.5)
nRBC: 0 % (ref 0.0–0.2)

## 2023-03-19 LAB — URINALYSIS, ROUTINE W REFLEX MICROSCOPIC
Bilirubin Urine: NEGATIVE
Glucose, UA: NEGATIVE mg/dL
Hgb urine dipstick: NEGATIVE
Ketones, ur: NEGATIVE mg/dL
Leukocytes,Ua: NEGATIVE
Nitrite: NEGATIVE
Protein, ur: NEGATIVE mg/dL
Specific Gravity, Urine: 1.018 (ref 1.005–1.030)
pH: 5.5 (ref 5.0–8.0)

## 2023-03-19 LAB — BASIC METABOLIC PANEL
Anion gap: 8 (ref 5–15)
BUN: 9 mg/dL (ref 6–20)
CO2: 27 mmol/L (ref 22–32)
Calcium: 8.9 mg/dL (ref 8.9–10.3)
Chloride: 100 mmol/L (ref 98–111)
Creatinine, Ser: 0.81 mg/dL (ref 0.44–1.00)
GFR, Estimated: >60 mL/min (ref >60)
Glucose, Bld: 102 mg/dL — ABNORMAL HIGH (ref 70–99)
Potassium: 3.9 mmol/L (ref 3.5–5.1)
Sodium: 135 mmol/L (ref 135–145)

## 2023-03-19 MED ORDER — MORPHINE SULFATE (PF) 2 MG/ML IV SOLN
2.0000 mg | Freq: Once | INTRAVENOUS | Status: AC
  Administered 2023-03-19: 2 mg via INTRAVENOUS
  Filled 2023-03-19: qty 1

## 2023-03-19 MED ORDER — KETOROLAC TROMETHAMINE 15 MG/ML IJ SOLN
15.0000 mg | Freq: Once | INTRAMUSCULAR | Status: AC
  Administered 2023-03-19: 15 mg via INTRAVENOUS
  Filled 2023-03-19: qty 1

## 2023-03-19 MED ORDER — OXYCODONE-ACETAMINOPHEN 5-325 MG PO TABS
1.0000 | ORAL_TABLET | ORAL | Status: AC | PRN
  Administered 2023-03-19 – 2023-03-20 (×2): 1 via ORAL
  Filled 2023-03-19 (×2): qty 1

## 2023-03-19 MED ORDER — IOHEXOL 300 MG/ML  SOLN
100.0000 mL | Freq: Once | INTRAMUSCULAR | Status: AC | PRN
  Administered 2023-03-19: 75 mL via INTRAVENOUS

## 2023-03-19 MED ORDER — LIDOCAINE 5 % EX PTCH
1.0000 | MEDICATED_PATCH | Freq: Once | CUTANEOUS | Status: DC
  Filled 2023-03-19: qty 1

## 2023-03-19 MED ORDER — ONDANSETRON 4 MG PO TBDP
4.0000 mg | ORAL_TABLET | Freq: Once | ORAL | Status: AC
  Administered 2023-03-19: 4 mg via ORAL
  Filled 2023-03-19: qty 1

## 2023-03-19 NOTE — ED Triage Notes (Signed)
L flank pain x 2 weeks. Had CT scheduled for today but states pain was too bad. Denies urinary symptoms.

## 2023-03-19 NOTE — ED Provider Notes (Signed)
[place] EMERGENCY DEPARTMENT AT [facility] Provider Note   CSN: [id number] Arrival date & time: 03/19/23  1518     History  Chief Complaint  Patient presents with   Flank Pain    Tonya Calhoun is a 57 y.o. female.  Is a 57 year old female presenting emergency department for left flank pain.  Symptoms been ongoing for the past 2 weeks.  Had some nausea and some vomiting last week.  Was treated for suspected stone, but never had any imaging done.  Was to have CT scan done today ordered by primary doctor, but could not make it secondary to pain.  She has had some intermittent shortness of breath over the same duration.  No fevers no chills no cough.   Flank Pain       Home Medications Prior to Admission medications   Medication Sig Start Date End Date Taking? Authorizing Provider  ALPRAZolam Prudy Feeler) 0.25 MG tablet Take one tablet by mouth threes times daily 11/25/20     Blood Glucose Monitoring Suppl (ONETOUCH VERIO FLEX SYSTEM) w/Device KIT  02/12/17   [provider]  clobetasol ointment (TEMOVATE) 0.05 % Apply 1 application topically 2 (two) times daily as needed for dry skin. 01/08/19   [provider]  doxycycline (ADOXA) 100 MG tablet Take 1 tablet (100 mg total) by mouth 2 (two) times daily with food for 7 days 01/11/21     Dulaglutide (TRULICITY) 3 MG/0.5ML SOPN Inject 0.5 ml (3 mg) under the skin every 7 days 11/25/20     EPINEPHrine (EPIPEN 2-PAK) 0.3 mg/0.3 mL IJ SOAJ injection Inject 0.3 mLs (0.3 mg total) into the muscle as needed for anaphylaxis. 12/04/19   Gilda Crease, MD  EPINEPHrine 0.3 mg/0.3 mL IJ SOAJ injection Inject 0.3 ml (1 pen) by intramuscular route once as directed for allergic reaction. 12/16/20     escitalopram (LEXAPRO) 5 MG tablet Take one tablet by mouth daily. 11/25/20     Ferrous Sulfate (IRON) 142 (45 Fe) MG TBCR Take by mouth daily.    [provider]  ibuprofen (ADVIL) 800 MG tablet Take 1 tablet (800  mg total) by mouth 3 (three) times daily. 03/11/23   Leath-Warren, Sadie Haber, NP  lisinopril (PRINIVIL,ZESTRIL) 2.5 MG tablet 2.5 mg daily.  01/12/17   [provider]  mupirocin ointment (BACTROBAN) 2 % Apply to the affected area of skin 3 times daily for 5 - 7 days 01/11/21     [facility] VERIO test strip  03/11/17   [provider]  oxyCODONE (OXY IR/ROXICODONE) 5 MG immediate release tablet Take 1 tablet (5 mg total) by mouth every 6 (six) hours as needed for severe pain. 09/13/22   Almond Lint, MD  tamsulosin (FLOMAX) 0.4 MG CAPS capsule Take 1 capsule (0.4 mg total) by mouth daily after supper. 03/11/23   Leath-Warren, Sadie Haber, NP  TRULICITY 3 MG/0.5ML [facility]  11/20/19   [provider]  Vitamin D, Cholecalciferol, 25 MCG (1000 UT) TABS Take by mouth daily.    [provider]      Allergies    Penicillins, Ampicillin, and Penicillins    Review of Systems   Review of Systems  Genitourinary:  Positive for flank pain.    Physical Exam Updated Vital Signs BP (!) 139/92   Pulse 87   Temp 98.5 F (36.9 C) (Oral)   Resp 18   Ht 5\' 6"  (1.676 m)   Wt 108.4 kg   LMP 08/28/2011   SpO2  96%   BMI 38.58 kg/m  Physical Exam Vitals and nursing note reviewed.  Constitutional:      General: She is not in acute distress.    Appearance: She is obese. She is not toxic-appearing.  HENT:     Head: Normocephalic.     Nose: Nose normal.     Mouth/Throat:     Mouth: Mucous membranes are moist.  Eyes:     Conjunctiva/sclera: Conjunctivae normal.  Cardiovascular:     Rate and Rhythm: Normal rate and regular rhythm.  Pulmonary:     Effort: Pulmonary effort is normal.     Breath sounds: Normal breath sounds.  Abdominal:     General: Abdomen is flat. There is no distension.     Palpations: Abdomen is soft.     Tenderness: There is no abdominal tenderness. There is left CVA tenderness.  Musculoskeletal:        General: Normal range of motion.  Skin:    General:  Skin is warm and dry.     Capillary Refill: Capillary refill takes less than 2 seconds.  Neurological:     Mental Status: She is alert and oriented to person, place, and time.  Psychiatric:        Mood and Affect: Mood normal.        Behavior: Behavior normal.     ED Results / Procedures / Treatments   Labs (all labs ordered are listed, but only abnormal results are displayed) Labs Reviewed  BASIC METABOLIC PANEL - Abnormal; Notable for the following components:      Result Value   Glucose, Bld 102 (*)    All other components within normal limits  CBC - Abnormal; Notable for the following components:   RBC 3.59 (*)    Hemoglobin 10.5 (*)    HCT 33.1 (*)    Platelets 462 (*)    All other components within normal limits  URINALYSIS, ROUTINE W REFLEX MICROSCOPIC    EKG None  Radiology CT Renal Stone Study  Result Date: 03/19/2023 CLINICAL DATA:  Left-sided flank pain EXAM: CT ABDOMEN AND PELVIS WITHOUT CONTRAST TECHNIQUE: Multidetector CT imaging of the abdomen and pelvis was performed following the standard protocol without IV contrast. RADIATION DOSE REDUCTION: This exam was performed according to the departmental dose-optimization program which includes automated exposure control, adjustment of the mA and/or kV according to patient size and/or use of iterative reconstruction technique. COMPARISON:  CT 04/20/2010 FINDINGS: Lower chest: Lung bases demonstrate trace left-sided pleural effusion. Mild left lower lobe atelectasis. Minimal bronchiectasis in the left lower lobe. Incompletely visualized large slightly complex pericardial effusion measuring up to 4.3 cm in thickness. Hepatobiliary: Liver is enlarged at 23 cm. Hepatic steatosis. No calcified gallstone or biliary dilatation Pancreas: Unremarkable. No pancreatic ductal dilatation or surrounding inflammatory changes. Spleen: Normal in size without focal abnormality. Adrenals/Urinary Tract: Adrenal glands are unremarkable. Kidneys  are normal, without renal calculi, focal lesion, or hydronephrosis. Bladder is unremarkable. Stomach/Bowel: Stomach is within normal limits. Appendix appears normal. No evidence of bowel wall thickening, distention, or inflammatory changes. Vascular/Lymphatic: Aortic atherosclerosis. No enlarged abdominal or pelvic lymph nodes. Reproductive: Uterus and bilateral adnexa are unremarkable. Other: No abdominal wall hernia or abnormality. No abdominopelvic ascites. Musculoskeletal: No acute or significant osseous findings. IMPRESSION: 1. Negative for hydronephrosis or ureteral stone. 2. Trace left-sided pleural effusion with atelectasis left base. 3. Incompletely visualized large slightly complex pericardial effusion. Recommend correlation with echocardiogram. 4. Hepatomegaly with hepatic steatosis. 5. Aortic atherosclerosis. Aortic Atherosclerosis (ICD10-I70.0).  Electronically Signed   By: Jasmine Pang M.D.   On: 03/19/2023 22:25    Procedures Procedures    Medications Ordered in ED Medications  oxyCODONE-acetaminophen (PERCOCET/ROXICET) 5-325 MG per tablet 1 tablet (1 tablet Oral Given 03/19/23 1538)  lidocaine (LIDODERM) 5 % 1 patch (0 patches Transdermal Hold 03/19/23 2043)  morphine (PF) 2 MG/ML injection 2 mg (has no administration in time range)  iohexol (OMNIPAQUE) 300 MG/ML solution 100 mL (has no administration in time range)  ondansetron (ZOFRAN-ODT) disintegrating tablet 4 mg (4 mg Oral Given 03/19/23 1538)  morphine (PF) 2 MG/ML injection 2 mg (2 mg Intravenous Given 03/19/23 2042)  ketorolac (TORADOL) 15 MG/ML injection 15 mg (15 mg Intravenous Given 03/19/23 2042)    ED Course/ Medical Decision Making/ A&P Clinical Course as of 03/19/23 2333  [facility] Mar 19, 2023  2229 CT Renal Stone Study IMPRESSION: 1. Negative for hydronephrosis or ureteral stone. 2. Trace left-sided pleural effusion with atelectasis left base. 3. Incompletely visualized large slightly complex pericardial effusion. Recommend  correlation with echocardiogram. 4. Hepatomegaly with hepatic steatosis. 5. Aortic atherosclerosis.   [TY]    Clinical Course User Index [TY] Coral Spikes, DO                                 Medical Decision Making Is a 57 year old female presenting emergency department for left flank pain.  She is afebrile vital signs reassuring although slightly hypertensive.  Physical exam with some left flank pain.  CT scan to evaluate for stone negative for acute renal pathology, but did show left pleural effusion and possible pericardial effusion.  Patient not currently having active chest pain does endorse some slight dyspnea.  Basic metabolic panel without significant Slight abnormalities.  Normal kidney function.  No leukocytosis to suggest systemic infection.  UA negative for UTI.  Given CT scan findings with pleural effusion and possible pericardial effusion will get CT scan to evaluate for cause of effusion. Care signed out to overnight team; dispo pending CT scan.   Amount and/or Complexity of Data Reviewed Radiology: ordered. Decision-making details documented in ED Course.  Risk Prescription drug management.         Final Clinical Impression(s) / ED Diagnoses Final diagnoses:  None    Rx / DC Orders ED Discharge Orders     None         Coral Spikes, DO 03/19/23 2333

## 2023-03-20 ENCOUNTER — Encounter (HOSPITAL_COMMUNITY)
Admission: EM | Disposition: A | Discharge status: Home / Self Care | Payer: Self-pay | Source: Home / Self Care | Attending: Internal Medicine

## 2023-03-20 ENCOUNTER — Inpatient Hospital Stay (HOSPITAL_COMMUNITY): Payer: 59

## 2023-03-20 DIAGNOSIS — E669 Obesity, unspecified: Secondary | ICD-10-CM | POA: Diagnosis present

## 2023-03-20 DIAGNOSIS — Z803 Family history of malignant neoplasm of breast: Secondary | ICD-10-CM | POA: Diagnosis not present

## 2023-03-20 DIAGNOSIS — I3139 Other pericardial effusion (noninflammatory): Secondary | ICD-10-CM | POA: Diagnosis present

## 2023-03-20 DIAGNOSIS — Z880 Allergy status to penicillin: Secondary | ICD-10-CM | POA: Diagnosis not present

## 2023-03-20 DIAGNOSIS — I10 Essential (primary) hypertension: Secondary | ICD-10-CM | POA: Diagnosis present

## 2023-03-20 DIAGNOSIS — Z833 Family history of diabetes mellitus: Secondary | ICD-10-CM | POA: Diagnosis not present

## 2023-03-20 DIAGNOSIS — Z79899 Other long term (current) drug therapy: Secondary | ICD-10-CM | POA: Diagnosis not present

## 2023-03-20 DIAGNOSIS — Z853 Personal history of malignant neoplasm of breast: Secondary | ICD-10-CM

## 2023-03-20 DIAGNOSIS — J9811 Atelectasis: Secondary | ICD-10-CM | POA: Diagnosis present

## 2023-03-20 DIAGNOSIS — I314 Cardiac tamponade: Secondary | ICD-10-CM | POA: Diagnosis present

## 2023-03-20 DIAGNOSIS — Z6839 Body mass index (BMI) 39.0-39.9, adult: Secondary | ICD-10-CM | POA: Diagnosis not present

## 2023-03-20 DIAGNOSIS — D649 Anemia, unspecified: Secondary | ICD-10-CM | POA: Diagnosis present

## 2023-03-20 DIAGNOSIS — Z8249 Family history of ischemic heart disease and other diseases of the circulatory system: Secondary | ICD-10-CM | POA: Diagnosis not present

## 2023-03-20 DIAGNOSIS — Z7985 Long-term (current) use of injectable non-insulin antidiabetic drugs: Secondary | ICD-10-CM | POA: Diagnosis not present

## 2023-03-20 DIAGNOSIS — Z923 Personal history of irradiation: Secondary | ICD-10-CM | POA: Diagnosis not present

## 2023-03-20 DIAGNOSIS — Z791 Long term (current) use of non-steroidal anti-inflammatories (NSAID): Secondary | ICD-10-CM | POA: Diagnosis not present

## 2023-03-20 DIAGNOSIS — E119 Type 2 diabetes mellitus without complications: Secondary | ICD-10-CM | POA: Diagnosis present

## 2023-03-20 DIAGNOSIS — Z23 Encounter for immunization: Secondary | ICD-10-CM | POA: Diagnosis not present

## 2023-03-20 HISTORY — PX: PERICARDIOCENTESIS: CATH118255

## 2023-03-20 LAB — ECHOCARDIOGRAM COMPLETE
Area-P 1/2: 4.36 cm2
Height: 66 in
S' Lateral: 2.5 cm
Weight: 3911.84 [oz_av]

## 2023-03-20 LAB — ECHOCARDIOGRAM LIMITED
Height: 66 in
Weight: 3911.84 [oz_av]

## 2023-03-20 LAB — GLUCOSE, CAPILLARY
Glucose-Capillary: 101 mg/dL — ABNORMAL HIGH (ref 70–99)
Glucose-Capillary: 165 mg/dL — ABNORMAL HIGH (ref 70–99)
Glucose-Capillary: 166 mg/dL — ABNORMAL HIGH (ref 70–99)
Glucose-Capillary: 88 mg/dL (ref 70–99)

## 2023-03-20 LAB — CBG MONITORING, ED
Glucose-Capillary: 106 mg/dL — ABNORMAL HIGH (ref 70–99)
Glucose-Capillary: 109 mg/dL — ABNORMAL HIGH (ref 70–99)

## 2023-03-20 LAB — MRSA NEXT GEN BY PCR, NASAL: MRSA by PCR Next Gen: NOT DETECTED

## 2023-03-20 SURGERY — PERICARDIOCENTESIS
Anesthesia: LOCAL

## 2023-03-20 MED ORDER — CHLORHEXIDINE GLUCONATE CLOTH 2 % EX PADS
6.0000 | MEDICATED_PAD | Freq: Every day | CUTANEOUS | Status: DC
  Administered 2023-03-20 – 2023-03-24 (×5): 6 via TOPICAL

## 2023-03-20 MED ORDER — SODIUM CHLORIDE 0.9 % IV SOLN: 250.0000 mL | INTRAVENOUS | Status: DC | PRN

## 2023-03-20 MED ORDER — SODIUM CHLORIDE 0.9% FLUSH
3.0000 mL | Freq: Two times a day (BID) | INTRAVENOUS | Status: DC
  Administered 2023-03-21 – 2023-03-24 (×7): 3 mL via INTRAVENOUS

## 2023-03-20 MED ORDER — SODIUM CHLORIDE 0.9% FLUSH
3.0000 mL | Freq: Two times a day (BID) | INTRAVENOUS | Status: DC
  Administered 2023-03-20 – 2023-03-24 (×3): 3 mL via INTRAVENOUS

## 2023-03-20 MED ORDER — COLCHICINE 0.6 MG PO TABS
0.6000 mg | ORAL_TABLET | Freq: Every day | ORAL | Status: DC
  Administered 2023-03-20 – 2023-03-24 (×5): 0.6 mg via ORAL
  Filled 2023-03-20 (×5): qty 1

## 2023-03-20 MED ORDER — ORAL CARE MOUTH RINSE: 15.0000 mL | OROMUCOSAL | Status: DC | PRN

## 2023-03-20 MED ORDER — ENOXAPARIN SODIUM 40 MG/0.4ML IJ SOSY: 40.0000 mg | PREFILLED_SYRINGE | INTRAMUSCULAR | Status: DC

## 2023-03-20 MED ORDER — INSULIN ASPART 100 UNIT/ML IJ SOLN
0.0000 [IU] | INTRAMUSCULAR | Status: DC
  Administered 2023-03-20 – 2023-03-21 (×3): 3 [IU] via SUBCUTANEOUS
  Administered 2023-03-21 – 2023-03-22 (×2): 2 [IU] via SUBCUTANEOUS
  Administered 2023-03-22 – 2023-03-23 (×3): 3 [IU] via SUBCUTANEOUS
  Administered 2023-03-23 – 2023-03-24 (×3): 2 [IU] via SUBCUTANEOUS
  Administered 2023-03-24: 3 [IU] via SUBCUTANEOUS

## 2023-03-20 MED ORDER — ACETAMINOPHEN 325 MG PO TABS
650.0000 mg | ORAL_TABLET | Freq: Four times a day (QID) | ORAL | Status: DC | PRN
  Administered 2023-03-21 (×2): 650 mg via ORAL
  Filled 2023-03-20 (×2): qty 2

## 2023-03-20 MED ORDER — MIDAZOLAM HCL 2 MG/2ML IJ SOLN
INTRAMUSCULAR | Status: DC | PRN
  Administered 2023-03-20: 1 mg via INTRAVENOUS

## 2023-03-20 MED ORDER — POTASSIUM CHLORIDE IN NACL 20-0.9 MEQ/L-% IV SOLN
INTRAVENOUS | Status: DC
  Filled 2023-03-20 (×3): qty 1000

## 2023-03-20 MED ORDER — ALBUTEROL SULFATE (2.5 MG/3ML) 0.083% IN NEBU
2.5000 mg | INHALATION_SOLUTION | Freq: Four times a day (QID) | RESPIRATORY_TRACT | Status: DC
  Administered 2023-03-20 – 2023-03-21 (×6): 2.5 mg via RESPIRATORY_TRACT
  Filled 2023-03-20 (×6): qty 3

## 2023-03-20 MED ORDER — ACETAMINOPHEN 650 MG RE SUPP: 650.0000 mg | Freq: Four times a day (QID) | RECTAL | Status: DC | PRN

## 2023-03-20 MED ORDER — LIDOCAINE HCL (PF) 1 % IJ SOLN
INTRAMUSCULAR | Status: AC
  Filled 2023-03-20: qty 30

## 2023-03-20 MED ORDER — MIDAZOLAM HCL 2 MG/2ML IJ SOLN
INTRAMUSCULAR | Status: AC
  Filled 2023-03-20: qty 2

## 2023-03-20 MED ORDER — INFLUENZA VIRUS VACC SPLIT PF (FLUZONE) 0.5 ML IM SUSY
0.5000 mL | PREFILLED_SYRINGE | INTRAMUSCULAR | Status: AC
  Administered 2023-03-21: 0.5 mL via INTRAMUSCULAR
  Filled 2023-03-20: qty 0.5

## 2023-03-20 MED ORDER — LIDOCAINE HCL (PF) 1 % IJ SOLN
INTRAMUSCULAR | Status: DC | PRN
  Administered 2023-03-20: 20 mL via INTRADERMAL
  Administered 2023-03-20: 30 mL via INTRADERMAL

## 2023-03-20 MED ORDER — MORPHINE SULFATE (PF) 2 MG/ML IV SOLN
2.0000 mg | Freq: Once | INTRAVENOUS | Status: AC
  Administered 2023-03-20: 2 mg via INTRAVENOUS
  Filled 2023-03-20: qty 1

## 2023-03-20 MED ORDER — HYDROMORPHONE HCL 1 MG/ML IJ SOLN
0.5000 mg | INTRAMUSCULAR | Status: DC | PRN
  Administered 2023-03-20: 0.5 mg via INTRAVENOUS
  Filled 2023-03-20: qty 1

## 2023-03-20 MED ORDER — ENOXAPARIN SODIUM 60 MG/0.6ML IJ SOSY
50.0000 mg | PREFILLED_SYRINGE | INTRAMUSCULAR | Status: DC
  Administered 2023-03-20 – 2023-03-23 (×4): 50 mg via SUBCUTANEOUS
  Filled 2023-03-20 (×5): qty 0.6

## 2023-03-20 MED ORDER — INDOMETHACIN ER 75 MG PO CPCR
75.0000 mg | ORAL_CAPSULE | Freq: Every day | ORAL | Status: DC
  Administered 2023-03-20 – 2023-03-24 (×5): 75 mg via ORAL
  Filled 2023-03-20 (×5): qty 1

## 2023-03-20 MED ORDER — SODIUM CHLORIDE 0.9 % IV SOLN: INTRAVENOUS | Status: DC

## 2023-03-20 MED ORDER — SODIUM CHLORIDE 0.9% FLUSH: 3.0000 mL | INTRAVENOUS | Status: DC | PRN

## 2023-03-20 MED ORDER — ONDANSETRON HCL 4 MG/2ML IJ SOLN
4.0000 mg | Freq: Four times a day (QID) | INTRAMUSCULAR | Status: DC | PRN
  Administered 2023-03-20: 4 mg via INTRAVENOUS
  Filled 2023-03-20: qty 2

## 2023-03-20 SURGICAL SUPPLY — 3 items
PACK CARDIAC CATHETERIZATION (CUSTOM PROCEDURE TRAY) IMPLANT
TRAY PERICARDIOCENTESIS 6FX60 (TRAY / TRAY PROCEDURE) IMPLANT
WIRE EMERALD 3MM-J .035X150CM (WIRE) IMPLANT

## 2023-03-20 NOTE — CV Procedure (Signed)
PROCEDURE:  Pericardiocentesis  CLINICAL HISTORY:  This is a 57 years old black female with HTN, Type 2 DM and s/p Right breast cancer had large pericardial effusion with tamponade..  The risks, benefits, and details of the procedure were explained to the patient.  The patient verbalized understanding and wanted to proceed.  Informed written consent was obtained.  PROCEDURE TECHNIQUE:  The patient was approached from the sub-xyphoid area. After sterile prep and drape and local anesthesia 6 french drain was placed using Seldinger technique and under ulrasound and fluroscopy guidance.  1100  hemorrhagic fluid was drawn out and sent lab for culture, chemistry, stains and cell count. Drained tube was sutured, dressed and patient was sent to [facility] for post procedure care. Patient's VS remained stable throughout the procedure.    CONTRAST:  No contrast use.  Complication: None.  Total sedation time : - 31 mins.  I was present 100 % of the time face to face during the procedure.

## 2023-03-20 NOTE — Progress Notes (Signed)
   03/20/23 1255  Spiritual Encounters  Type of Visit Initial  Care provided to: Pt and family  Referral source Other (comment) (Spiritual Consult)  Reason for visit Advance directives  OnCall Visit No   Chaplain responded to a spiritual consult for advanced directive education.  The patient, Tonya Calhoun, asked that I leave the paperwork with her to complete at her pace.  I advised her if she had any questions to ask a nurse to contact us and someone will respond.   [facility]  [phone]

## 2023-03-20 NOTE — H&P (Signed)
History and Physical    Patient: Tonya Calhoun ZOX:[id number] DOB: 1966-02-14 DOA: 03/19/2023 DOS: the patient was seen and examined on 03/20/2023 PCP: Avis Epley, PA-C  Patient coming from: Transfer from [facility]  Chief Complaint:  Chief Complaint  Patient presents with   Flank Pain   HPI: Tonya Calhoun is a 57 y.o. 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  Symptoms have been present over the last 2 weeks.  She reports having a dull pain that comes and goes.  She had associated symptoms of a nonproductive cough, shortness of breath, nausea, and episode of vomiting last week..  Denied having any recent fevers, recent sick contacts, leg swelling, or dysuria symptoms..  She had seen her primary for her symptoms.  Her primary had given her prescription for Flomax and ordered a CT, but due to the pain she was cannot wait for the appointment and came to the hospital for further evaluation.  Symptoms were thought possibly secondary to a kidney stone.  In the emergency department patient was noted to be afebrile, respirations 16-28, blood pressures maintained, and O2 saturations noted to be transiently as low as 87% on 2 L of nasal cannula oxygen.  Labs noted hemoglobin 10.5 and platelets 462. Urinalysis did not note any acute abnormality.  CT scan of the abdomen pelvis was done which noted a left-sided pleural effusion with atelectasis of the left base, and lately visualize slightly complex, pericardial effusion.  CT of the chest was subsequently done which confirmed large hyperdense pericardial effusion suggesting proteinaceous or hemorrhagic fluid.  Cardiology had been formally consulted.  Patient had been given Zofran, morphine, ketorolac, and placed on normal saline with 20 mill equivalents of KCl at  75 mL/h prior to arrival.  Review of Systems: As mentioned in the history of present illness. All other systems reviewed and are negative. Past Medical History:  Diagnosis Date   Breast cancer in female [facility]) 08/2022   Diabetes mellitus    Diabetes mellitus without complication (HCC)    Hypertension    Urticaria    Past Surgical History:  Procedure Laterality Date   ANTERIOR AND POSTERIOR REPAIR  09/26/2011   Procedure: ANTERIOR (CYSTOCELE) AND POSTERIOR REPAIR (RECTOCELE);  Surgeon: Purcell Nails, MD;  Location: [facility];  Service: Gynecology;  Laterality: N/A;   BREAST LUMPECTOMY Right 09/13/2022   Procedure: RIGHT BREAST CENTRAL LUMPECTOMY;  Surgeon: Almond Lint, MD;  Location: [facility];  Service: General;  Laterality: Right;   BREAST SURGERY     reduction   COLONOSCOPY N/A 05/02/2019   Procedure: COLONOSCOPY;  Surgeon: Corbin Ade, MD;  Location: [facility];  Service: Endoscopy;  Laterality: N/A;  9:30   CYSTOSCOPY  09/26/2011   Procedure: CYSTOSCOPY;  Surgeon: Purcell Nails, MD;  Location: [facility];  Service: Gynecology;  Laterality: Bilateral;   REDUCTION MAMMAPLASTY Bilateral 2020   TUBAL LIGATION     VAGINAL HYSTERECTOMY  09/26/2011   Procedure: HYSTERECTOMY VAGINAL;  Surgeon: Purcell Nails, MD;  Location: [facility];  Service: Gynecology;  Laterality: N/A;  TOTAL VAGINAL HYSTERECTOMY; POSSIBLE ANTERIOR AND POSTERIOR REPAIR, CYSTOSCOPY   Social History:  reports that she has never smoked. She has never used smokeless tobacco. She reports current alcohol use. She reports that she does not use drugs.  Allergies  Allergen Reactions  Penicillins Hives   Ampicillin    Penicillins     Family History  Problem Relation Age of Onset   Diabetes Mother    Hypertension Mother    Prostate cancer Father    Diabetes Father    Breast cancer Maternal Grandmother    Allergic rhinitis Neg Hx    Asthma Neg Hx    Eczema Neg Hx    Urticaria Neg Hx      Prior to Admission medications   Medication Sig Start Date End Date Taking? Authorizing Provider  celecoxib (CELEBREX) 200 MG capsule Take 200 mg by mouth daily. 03/03/23  Yes [provider]  traMADol (ULTRAM) 50 MG tablet Take 50 mg by mouth every 6 (six) hours. 03/16/23  Yes [provider]  clobetasol ointment (TEMOVATE) 0.05 % Apply 1 application topically 2 (two) times daily as needed for dry skin. 01/08/19   [provider]  EPINEPHrine 0.3 mg/0.3 mL IJ SOAJ injection Inject 0.3 ml (1 pen) by intramuscular route once as directed for allergic reaction. 12/16/20     escitalopram (LEXAPRO) 5 MG tablet Take one tablet by mouth daily. 11/25/20     Ferrous Sulfate (IRON) 142 (45 Fe) MG TBCR Take by mouth daily.    [provider]  ibuprofen (ADVIL) 800 MG tablet Take 1 tablet (800 mg total) by mouth 3 (three) times daily. 03/11/23   Leath-Warren, Sadie Haber, NP  lisinopril (PRINIVIL,ZESTRIL) 2.5 MG tablet 2.5 mg daily.  01/12/17   [provider]  mupirocin ointment (BACTROBAN) 2 % Apply to the affected area of skin 3 times daily for 5 - 7 days 01/11/21     tamsulosin (FLOMAX) 0.4 MG CAPS capsule Take 1 capsule (0.4 mg total) by mouth daily after supper. 03/11/23   Leath-Warren, Sadie Haber, NP  TRULICITY 3 MG/0.5ML [facility]  11/20/19   [provider]  Vitamin D, Cholecalciferol, 25 MCG (1000 UT) TABS Take by mouth daily.    [provider]    Physical Exam: Vitals:   03/20/23 0700 03/20/23 0730 03/20/23 1039 03/20/23 1040  BP: (!) 147/98 (!) 120/99  (!) 147/89  Pulse: 87 79  90  Resp: 18 (!) 21  16  Temp:    98 F (36.7 C)  TempSrc:    Oral  SpO2: 96% 98%  98%  Weight:   110.9 kg   Height:   5\' 6"  (1.676 m)       Constitutional: Middle-age female currently in no acute distress Eyes: PERRL, lids and conjunctivae normal ENMT: Mucous membranes are moist.   Neck: normal, supple, JVD present. Respiratory: clear to auscultation  bilaterally, no wheezing, no crackles. Normal respiratory effort.   Cardiovascular: Regular rate and rhythm, no murmurs / rubs / gallops. No extremity edema  Abdomen: no tenderness, no masses palpated. No hepatosplenomegaly. Bowel sounds positive.  Musculoskeletal: no clubbing / cyanosis. No joint deformity upper and lower extremities. Good ROM, no contractures. Normal muscle tone.  Skin: no rashes, lesions, ulcers. No induration Neurologic: CN 2-12 grossly intact. Sensation intact, DTR normal. Strength 5/5 in all 4.  Psychiatric: Normal judgment and insight. Alert and oriented x 3. Normal mood.   Data Reviewed:  EKG reveals sinus rhythm at 85 bpm with low voltage.  Reviewed labs, imaging, and pertinent records as documented in this note.  Assessment and Plan:  Pericardial effusion Acute.  Patient presented with complaints of left lower quadrant flank pain concerning for kidney stone, but found to have pericardial effusion on CT  scan of the abdomen and subsequently chest.  On physical exam patient with JVD present.  Blood pressures are currently maintained. -Admit to a progressive bed -Check echocardiogram -Dr. Algie Coffer cardiology consulted, will follow-up for any further recommendations.  Normocytic anemia Hemoglobin 10.5 which appears lower than last available of 11.7 back in 11/2019. -Recheck CBC tomorrow morning  Essential hypertension Blood pressures are currently maintained. -Hold lisinopril  History of breast cancer Patient with a history of Paget's disease diagnosed with DCIS involving nipple dermis 08/2022. Patient status post lumpectomy with subsequent radiation treatment. -Continue outpatient follow-up with hematology oncology  Obesity BMI 39.46 kg/m  DVT prophylaxis: Lovenox Advance Care Planning:   Code Status: Full Code    Consults: Cardiology Family Communication: Family request to be updated at this time.  Severity of Illness: The appropriate patient status for  this patient is INPATIENT. Inpatient status is judged to be reasonable and necessary in order to provide the required intensity of service to ensure the patient's safety. The patient's presenting symptoms, physical exam findings, and initial radiographic and laboratory data in the context of their chronic comorbidities is felt to place them at high risk for further clinical deterioration. Furthermore, it is not anticipated that the patient will be medically stable for discharge from the hospital within 2 midnights of admission.   * I certify that at the point of admission it is my clinical judgment that the patient will require inpatient hospital care spanning beyond 2 midnights from the point of admission due to high intensity of service, high risk for further deterioration and high frequency of surveillance required.*  Author: Clydie Braun, MD 03/20/2023 10:55 AM  For on call review [email or web address].

## 2023-03-20 NOTE — ED Notes (Signed)
Patient placed on 2LNC d/t desaturations following med administration.

## 2023-03-20 NOTE — Consult Note (Signed)
Referring Physician: Madelyn Flavors, MD  Tonya Calhoun is an 57 y.o. female.                       Chief Complaint: Flank pain + pericardial effusion with shortness of breath.  HPI: 57 years old black female with PMH of HTN, type 2 DM, Pagets's disease of the nipple, s/p lumpectomy and radiation has left flank pain.  CT scan picked up large perixardial effusion. Echocardiogram conformed it today.  Past Medical History:  Diagnosis Date   Breast cancer in female [facility]) 08/2022   Diabetes mellitus    Diabetes mellitus without complication (HCC)    Hypertension    Urticaria       Past Surgical History:  Procedure Laterality Date   ANTERIOR AND POSTERIOR REPAIR  09/26/2011   Procedure: ANTERIOR (CYSTOCELE) AND POSTERIOR REPAIR (RECTOCELE);  Surgeon: Purcell Nails, MD;  Location: [facility];  Service: Gynecology;  Laterality: N/A;   BREAST LUMPECTOMY Right 09/13/2022   Procedure: RIGHT BREAST CENTRAL LUMPECTOMY;  Surgeon: Almond Lint, MD;  Location:  SURGERY CENTER;  Service: General;  Laterality: Right;   BREAST SURGERY     reduction   COLONOSCOPY N/A 05/02/2019   Procedure: COLONOSCOPY;  Surgeon: Corbin Ade, MD;  Location: [facility];  Service: Endoscopy;  Laterality: N/A;  9:30   CYSTOSCOPY  09/26/2011   Procedure: CYSTOSCOPY;  Surgeon: Purcell Nails, MD;  Location: [facility];  Service: Gynecology;  Laterality: Bilateral;   REDUCTION MAMMAPLASTY Bilateral 2020   TUBAL LIGATION     VAGINAL HYSTERECTOMY  09/26/2011   Procedure: HYSTERECTOMY VAGINAL;  Surgeon: Purcell Nails, MD;  Location: [facility];  Service: Gynecology;  Laterality: N/A;  TOTAL VAGINAL HYSTERECTOMY; POSSIBLE ANTERIOR AND POSTERIOR REPAIR, CYSTOSCOPY    Family History  Problem Relation Age of Onset   Diabetes Mother    Hypertension Mother    Prostate cancer Father    Diabetes Father    Breast cancer Maternal Grandmother    Allergic rhinitis Neg Hx    Asthma Neg Hx    Eczema Neg Hx     Urticaria Neg Hx    Social History:  reports that she has never smoked. She has never used smokeless tobacco. She reports current alcohol use. She reports that she does not use drugs.  Allergies:  Allergies  Allergen Reactions   Penicillins Hives   Ampicillin    Penicillins     Medications Prior to Admission  Medication Sig Dispense Refill   celecoxib (CELEBREX) 200 MG capsule Take 200 mg by mouth daily.     traMADol (ULTRAM) 50 MG tablet Take 50 mg by mouth every 6 (six) hours.     clobetasol ointment (TEMOVATE) 0.05 % Apply 1 application topically 2 (two) times daily as needed for dry skin.     EPINEPHrine 0.3 mg/0.3 mL IJ SOAJ injection Inject 0.3 ml (1 pen) by intramuscular route once as directed for allergic reaction. 2 each 0   escitalopram (LEXAPRO) 5 MG tablet Take one tablet by mouth daily. 30 tablet 2   Ferrous Sulfate (IRON) 142 (45 Fe) MG TBCR Take by mouth daily.     ibuprofen (ADVIL) 800 MG tablet Take 1 tablet (800 mg total) by mouth 3 (three) times daily. 30 tablet 0   lisinopril (PRINIVIL,ZESTRIL) 2.5 MG tablet 2.5 mg daily.      mupirocin ointment (BACTROBAN) 2 % Apply to the affected area of skin 3 times daily for  5 - 7 days 22 g 0   tamsulosin (FLOMAX) 0.4 MG CAPS capsule Take 1 capsule (0.4 mg total) by mouth daily after supper. 30 capsule 0   TRULICITY 3 MG/0.5ML SOPN      Vitamin D, Cholecalciferol, 25 MCG (1000 UT) TABS Take by mouth daily.      Results for orders placed or performed during the hospital encounter of 03/19/23 (from the past 48 hour(s))  Urinalysis, Routine w reflex microscopic -Urine, Clean Catch     Status: None   Collection Time: 03/19/23  3:39 PM  Result Value Ref Range   Color, Urine YELLOW YELLOW   APPearance CLEAR CLEAR   Specific Gravity, Urine 1.018 1.005 - 1.030   pH 5.5 5.0 - 8.0   Glucose, UA NEGATIVE NEGATIVE mg/dL   Hgb urine dipstick NEGATIVE NEGATIVE   Bilirubin Urine NEGATIVE NEGATIVE   Ketones, ur NEGATIVE NEGATIVE mg/dL    Protein, ur NEGATIVE NEGATIVE mg/dL   Nitrite NEGATIVE NEGATIVE   Leukocytes,Ua NEGATIVE NEGATIVE    Comment: Performed at [facility], [address], [place], [place] [address]  Basic metabolic panel     Status: Abnormal   Collection Time: 03/19/23  4:03 PM  Result Value Ref Range   Sodium 135 135 - 145 mmol/L   Potassium 3.9 3.5 - 5.1 mmol/L   Chloride 100 98 - 111 mmol/L   CO2 27 22 - 32 mmol/L   Glucose, Bld 102 (H) 70 - 99 mg/dL    Comment: Glucose reference range applies only to samples taken after fasting for at least 8 hours.   BUN 9 6 - 20 mg/dL   Creatinine, Ser 6.04 0.44 - 1.00 mg/dL   Calcium 8.9 8.9 - 54.0 mg/dL   GFR, Estimated >98 >11 mL/min    Comment: (NOTE) Calculated using the CKD-EPI Creatinine Equation (2021)    Anion gap 8 5 - 15    Comment: Performed at [facility], [address] [place], [place] [address]  CBC     Status: Abnormal   Collection Time: 03/19/23  4:03 PM  Result Value Ref Range   WBC 6.8 4.0 - 10.5 K/uL   RBC 3.59 (L) 3.87 - 5.11 MIL/uL   Hemoglobin 10.5 (L) 12.0 - 15.0 g/dL   HCT 29.5 (L) 62.1 - 30.8 %   MCV 92.2 80.0 - 100.0 fL   MCH 29.2 26.0 - 34.0 pg   MCHC 31.7 30.0 - 36.0 g/dL   RDW 65.7 84.6 - 96.2 %   Platelets 462 (H) 150 - 400 K/uL   nRBC 0.0 0.0 - 0.2 %    Comment: Performed at [facility], [address] [place], [place] [address]  CBG monitoring, ED     Status: Abnormal   Collection Time: 03/20/23  4:49 AM  Result Value Ref Range   Glucose-Capillary 106 (H) 70 - 99 mg/dL    Comment: Glucose reference range applies only to samples taken after fasting for at least 8 hours.  CBG monitoring, ED     Status: Abnormal   Collection Time: 03/20/23  8:26 AM  Result Value Ref Range   Glucose-Capillary 109 (H) 70 - 99 mg/dL    Comment: Glucose reference range applies only to samples taken after fasting for at least 8 hours.  Glucose, capillary     Status: Abnormal    Collection Time: 03/20/23 11:05 AM  Result Value Ref Range   Glucose-Capillary 166 (H) 70 - 99 mg/dL  Comment: Glucose reference range applies only to samples taken after fasting for at least 8 hours.   CT Chest W Contrast  Result Date: 03/20/2023 CLINICAL DATA:  Pleural effusion, pericardial effusion EXAM: CT CHEST WITH CONTRAST TECHNIQUE: Multidetector CT imaging of the chest was performed during intravenous contrast administration. RADIATION DOSE REDUCTION: This exam was performed according to the departmental dose-optimization program which includes automated exposure control, adjustment of the mA and/or kV according to patient size and/or use of iterative reconstruction technique. CONTRAST:  75mL OMNIPAQUE IOHEXOL 300 MG/ML  SOLN COMPARISON:  None Available. FINDINGS: Cardiovascular: No significant coronary artery calcification. Global cardiac size is within normal limits. A large pericardial effusion is present with hyperdense fluid suggesting proteinaceous or hemorrhagic fluid. No superimposed pericardial enhancement or nodularity. No remodeling of the ventricles, dimpling of the right ventricular wall, or intraventricular septal shift to suggest changes of cardiac tamponade. The central pulmonary arteries are of normal caliber. The thoracic aorta is unremarkable. Mediastinum/Nodes: No enlarged mediastinal, hilar, or axillary lymph nodes. Thyroid gland, trachea, and esophagus demonstrate no significant findings. Lungs/Pleura: Bilateral discoid atelectasis. No confluent pulmonary infiltrate. No pneumothorax. Trace left pleural effusion. Central airways are widely patent. Upper Abdomen: Hepatic steatosis.  No acute abnormality. Musculoskeletal: No chest wall abnormality. No acute or significant osseous findings. IMPRESSION: 1. Large hyperdense pericardial effusion suggesting proteinaceous or hemorrhagic fluid. No superimposed pericardial enhancement or nodularity. No CT evidence of cardiac  tamponade. 2. Hepatic steatosis. Electronically Signed   By: Helyn Numbers M.D.   On: 03/20/2023 01:09   CT Renal Stone Study  Result Date: 03/19/2023 CLINICAL DATA:  Left-sided flank pain EXAM: CT ABDOMEN AND PELVIS WITHOUT CONTRAST TECHNIQUE: Multidetector CT imaging of the abdomen and pelvis was performed following the standard protocol without IV contrast. RADIATION DOSE REDUCTION: This exam was performed according to the departmental dose-optimization program which includes automated exposure control, adjustment of the mA and/or kV according to patient size and/or use of iterative reconstruction technique. COMPARISON:  CT 04/20/2010 FINDINGS: Lower chest: Lung bases demonstrate trace left-sided pleural effusion. Mild left lower lobe atelectasis. Minimal bronchiectasis in the left lower lobe. Incompletely visualized large slightly complex pericardial effusion measuring up to 4.3 cm in thickness. Hepatobiliary: Liver is enlarged at 23 cm. Hepatic steatosis. No calcified gallstone or biliary dilatation Pancreas: Unremarkable. No pancreatic ductal dilatation or surrounding inflammatory changes. Spleen: Normal in size without focal abnormality. Adrenals/Urinary Tract: Adrenal glands are unremarkable. Kidneys are normal, without renal calculi, focal lesion, or hydronephrosis. Bladder is unremarkable. Stomach/Bowel: Stomach is within normal limits. Appendix appears normal. No evidence of bowel wall thickening, distention, or inflammatory changes. Vascular/Lymphatic: Aortic atherosclerosis. No enlarged abdominal or pelvic lymph nodes. Reproductive: Uterus and bilateral adnexa are unremarkable. Other: No abdominal wall hernia or abnormality. No abdominopelvic ascites. Musculoskeletal: No acute or significant osseous findings. IMPRESSION: 1. Negative for hydronephrosis or ureteral stone. 2. Trace left-sided pleural effusion with atelectasis left base. 3. Incompletely visualized large slightly complex pericardial  effusion. Recommend correlation with echocardiogram. 4. Hepatomegaly with hepatic steatosis. 5. Aortic atherosclerosis. Aortic Atherosclerosis (ICD10-I70.0). Electronically Signed   By: Jasmine Pang M.D.   On: 03/19/2023 22:25    Review Of Systems Constitutional: No fever, chills, weight loss or gain. Eyes: No vision change, wears glasses. No discharge or pain. Ears: No hearing loss, No tinnitus. Respiratory: No asthma, COPD, pneumonias. Positive shortness of breath. No hemoptysis. Cardiovascular: No chest pain, palpitation, leg edema. Gastrointestinal: No nausea, vomiting, diarrhea, constipation. No GI bleed. No hepatitis. Positive left flank pain.  Genitourinary: No dysuria, hematuria, kidney stone. No incontinance. Neurological: No headache, stroke, seizures.  Psychiatry: No psych facility admission for anxiety, depression, suicide. No detox. Skin: No rash. Musculoskeletal: No joint pain, fibromyalgia. No neck pain, back pain. Lymphadenopathy: No lymphadenopathy. Hematology: No anemia or easy bruising.   Blood pressure (!) 147/89, pulse 90, temperature 98 F (36.7 C), temperature source Oral, resp. rate 16, height 5\' 6"  (1.676 m), weight 110.9 kg, last menstrual period 08/28/2011, SpO2 98%. Body mass index is 39.46 kg/m. General appearance: alert, cooperative, appears stated age and no distress Head: Normocephalic, atraumatic. Eyes: Brown eyes, pink conjunctiva, corneas clear.  Neck: No adenopathy, no carotid bruit, no JVD, supple, symmetrical, trachea midline and thyroid not enlarged. Resp: Clear to auscultation bilaterally. Cardio: Regular rate and rhythm, S1, S2 normal, II/VI systolic murmur, no click, rub or gallop GI: Soft, non-tender; bowel sounds normal; no organomegaly. Extremities: No edema, cyanosis or clubbing. Skin: Warm and dry.  Neurologic: Alert and oriented X 3, normal strength. Normal coordination and gait.  Assessment/Plan Large pericardial effusion Early  cardiac tamponade S/P Right breast cancer Type 2 DM HTN  Plan: Pericardiocentesis with drain. Patient understood procedure and risks  Time spent: Review of old records, Lab, x-rays, EKG, other cardiac tests, examination, discussion with patient/Doctor/Family/Tech over 70 minutes.  Ricki Rodriguez, MD  03/20/2023, 1:35 PM

## 2023-03-20 NOTE — ED Notes (Signed)
BS 109

## 2023-03-20 NOTE — ED Notes (Signed)
Tonya Calhoun at Cl will send transport for Bed Ready at [facility] 3E RM#[id number].-[id number](NS)

## 2023-03-20 NOTE — ED Provider Notes (Signed)
  Provider Note MRN:  [id number]  Arrival date & time: 03/20/23    ED Course and Medical Decision Making  Assumed care from Dr. Maple Hudson at shift change.  Flank pain initially there was suspicion for kidney stone, no stone but partial visualization of pleural effusion, possible pericardial effusion.  Awaiting CT chest.  CT chest reveals large pericardial effusion.  Case discussed with cardiology recommending admission for echocardiogram.  Procedures  Final Clinical Impressions(s) / ED Diagnoses     ICD-10-CM   1. Pericardial effusion  I31.39       ED Discharge Orders     None       Discharge Instructions   None     Elmer Sow. Pilar Plate, MD [facility] Emergency Medicine [facility] [email or web address]    Sabas Sous, MD 03/20/23 [phone]

## 2023-03-21 ENCOUNTER — Encounter (HOSPITAL_COMMUNITY): Payer: Self-pay | Admitting: Cardiovascular Disease

## 2023-03-21 DIAGNOSIS — I3139 Other pericardial effusion (noninflammatory): Secondary | ICD-10-CM | POA: Diagnosis not present

## 2023-03-21 LAB — GLUCOSE, CAPILLARY
Glucose-Capillary: 102 mg/dL — ABNORMAL HIGH (ref 70–99)
Glucose-Capillary: 118 mg/dL — ABNORMAL HIGH (ref 70–99)
Glucose-Capillary: 148 mg/dL — ABNORMAL HIGH (ref 70–99)
Glucose-Capillary: 152 mg/dL — ABNORMAL HIGH (ref 70–99)
Glucose-Capillary: 88 mg/dL (ref 70–99)
Glucose-Capillary: 93 mg/dL (ref 70–99)

## 2023-03-21 LAB — CBC
HCT: 34.6 % — ABNORMAL LOW (ref 36.0–46.0)
Hemoglobin: 10.6 g/dL — ABNORMAL LOW (ref 12.0–15.0)
MCH: 30.3 pg (ref 26.0–34.0)
MCHC: 30.6 g/dL (ref 30.0–36.0)
MCV: 98.9 fL (ref 80.0–100.0)
Platelets: 342 10*3/uL (ref 150–400)
RBC: 3.5 MIL/uL — ABNORMAL LOW (ref 3.87–5.11)
RDW: 13 % (ref 11.5–15.5)
WBC: 7.3 10*3/uL (ref 4.0–10.5)
nRBC: 0 % (ref 0.0–0.2)

## 2023-03-21 LAB — HEMOGLOBIN A1C
Hgb A1c MFr Bld: 6.5 % — ABNORMAL HIGH (ref 4.8–5.6)
Mean Plasma Glucose: 140 mg/dL

## 2023-03-21 LAB — BASIC METABOLIC PANEL
Anion gap: 13 (ref 5–15)
BUN: 8 mg/dL (ref 6–20)
CO2: 23 mmol/L (ref 22–32)
Calcium: 8.4 mg/dL — ABNORMAL LOW (ref 8.9–10.3)
Chloride: 101 mmol/L (ref 98–111)
Creatinine, Ser: 0.91 mg/dL (ref 0.44–1.00)
GFR, Estimated: >60 mL/min (ref >60)
Glucose, Bld: 149 mg/dL — ABNORMAL HIGH (ref 70–99)
Potassium: 3.9 mmol/L (ref 3.5–5.1)
Sodium: 137 mmol/L (ref 135–145)

## 2023-03-21 LAB — HIV ANTIBODY (ROUTINE TESTING W REFLEX): HIV Screen 4th Generation wRfx: NONREACTIVE

## 2023-03-21 LAB — GLUCOSE, BODY FLUID OTHER: Glucose, Body Fluid Other: 353 mg/dL

## 2023-03-21 NOTE — TOC CM/SW Note (Signed)
Transition of Care [facility]) - Inpatient Brief Assessment   Patient Details  Name: Tonya Calhoun MRN: [id number] Date of Birth: [birth year]  Transition [facility] Care [facility]) CM/SW Contact:    Gala Lewandowsky, RN Phone Number: 03/21/2023, 11:33 AM   Clinical Narrative: Patient presented for flank pain and  pericardial effusion with shortness of breath-hemovac drain in place. PTA patient was independent from home and has family support of father whom was at the bedside. Patient has insurance and PCP; has no issues getting to appointments. Case Manager will continue to follow for transition of care needs as the patient progresses.     Transition of Care Asessment: Insurance and Status: Insurance coverage has been reviewed Patient has primary care physician: Yes Home environment has been reviewed: reviewed Prior level of function:: independent Prior/Current Home Services: No current home services Social Determinants of Health Reivew: SDOH reviewed no interventions necessary Readmission risk has been reviewed: Yes Transition of care needs: no transition of care needs at this time

## 2023-03-21 NOTE — Progress Notes (Signed)
PROGRESS NOTE  Tonya Calhoun NWG:[id number] DOB: 11-17-1965 DOA: 03/19/2023 PCP: Avis Epley, PA-C  HPI/Recap of past 24 hours: Tonya Calhoun is a 57 y.o. 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 In the ED, VSS except for 87% on 2 L of nasal cannula oxygen. CT of the chest was subsequently done which confirmed large hyperdense pericardial effusion suggesting proteinaceous or hemorrhagic fluid.  Cardiology consulted s/p pericardiocentesis. Pt admitted for further management.    Today, pt denies any new complaints.  Denies any chest pain, shortness of breath, abdominal pain, nausea/vomiting, fever/chills.    Assessment/Plan: Principal Problem:   Pericardial effusion Active Problems:   Normocytic anemia   Essential hypertension   History of breast cancer   Obesity (BMI 30-39.9)   Large Pericardial effusion Noted on CT chest and confirmed with ECHO Cardiology Dr Algie Coffer consulted, s/p pericardiocentesis on 9/10, with 1100cc of hemorrhagic fluid drainage  ECHO post procedure showed EF of 60-65%, resolved cardiac tamponade  Continue drain care Monitor closely, further management per cardiology   Normocytic anemia Hemoglobin around baseline Daily CBC   Essential hypertension Blood pressures are currently maintained. 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 Continue outpatient follow-up with hematology oncology   Obesity BMI 39.46 kg/m    Estimated body mass index is 38.96 kg/m as calculated from the following:   Height as of this encounter: 5\' 6"  (1.676 m).   Weight as of this encounter: 109.5 kg.     Code Status:  Full  Family Communication: Friends at bedside  Disposition Plan: Status is: Inpatient Remains inpatient appropriate because: Level of care      Consultants: Cardiology  Procedures: Pericardiocentesis  Antimicrobials: None  DVT prophylaxis: Lovenox   Objective: Vitals:   03/21/23 1200 03/21/23 1300 03/21/23 1400 03/21/23 1500  BP: 123/73 127/78 125/76 139/76  Pulse: 83 80 83 88  Resp: (!) 23 15 (!) 25 (!) 31  Temp:      TempSrc:      SpO2: 98% 100% 99% 96%  Weight:      Height:        Intake/Output Summary (Last 24 hours) at 03/21/2023 1529 Last data filed at 03/21/2023 1500 Gross per 24 hour  Intake 1971.26 ml  Output 2005 ml  Net -33.74 ml   Filed Weights   03/20/23 1039 03/20/23 1630 03/21/23 0451  Weight: 110.9 kg 109.7 kg 109.5 kg    Exam: General: NAD  Cardiovascular: S1, S2 present, drain was recently removed due to malfunction Respiratory: CTAB Abdomen: Soft, nontender, nondistended, bowel sounds present Musculoskeletal: No bilateral pedal edema noted Skin: Normal Psychiatry: Normal mood     Data Reviewed: CBC: Recent Labs  Lab 03/19/23 1603 03/21/23 0254  WBC 6.8 7.3  HGB 10.5* 10.6*  HCT 33.1* 34.6*  MCV 92.2 98.9  PLT 462* 342   Basic Metabolic Panel: Recent Labs  Lab 03/19/23 1603 03/21/23 0254  NA 135 137  K 3.9 3.9  CL 100 101  CO2 27 23  GLUCOSE 102* 149*  BUN 9 8  CREATININE 0.81 0.91  CALCIUM 8.9 8.4*   GFR: Estimated Creatinine Clearance: 88.6 mL/min (by C-G formula based on SCr of 0.91 mg/dL). Liver  Function Tests: No results for input(s): "AST", "ALT", "ALKPHOS", "BILITOT", "PROT", "ALBUMIN" in the last 168 hours. No results for input(s): "LIPASE", "AMYLASE" in the last 168 hours. No results for input(s): "AMMONIA" in the last 168 hours. Coagulation Profile: No results for input(s): "INR", "PROTIME" in the last 168 hours. Cardiac Enzymes: No results for input(s): "CKTOTAL", "CKMB", "CKMBINDEX",  "TROPONINI" in the last 168 hours. BNP (last 3 results) No results for input(s): "PROBNP" in the last 8760 hours. HbA1C: Recent Labs    03/20/23 0556  HGBA1C 6.5*   CBG: Recent Labs  Lab 03/20/23 2008 03/20/23 2354 03/21/23 0354 03/21/23 0754 03/21/23 1132  GLUCAP 165* 101* 152* 88 93   Lipid Profile: No results for input(s): "CHOL", "HDL", "LDLCALC", "TRIG", "CHOLHDL", "LDLDIRECT" in the last 72 hours. Thyroid Function Tests: No results for input(s): "TSH", "T4TOTAL", "FREET4", "T3FREE", "THYROIDAB" in the last 72 hours. Anemia Panel: No results for input(s): "VITAMINB12", "FOLATE", "FERRITIN", "TIBC", "IRON", "RETICCTPCT" in the last 72 hours. Urine analysis:    Component Value Date/Time   COLORURINE YELLOW 03/19/2023 1539   APPEARANCEUR CLEAR 03/19/2023 1539   LABSPEC 1.018 03/19/2023 1539   PHURINE 5.5 03/19/2023 1539   GLUCOSEU NEGATIVE 03/19/2023 1539   HGBUR NEGATIVE 03/19/2023 1539   BILIRUBINUR NEGATIVE 03/19/2023 1539   BILIRUBINUR negative 03/11/2023 1419   KETONESUR NEGATIVE 03/19/2023 1539   PROTEINUR NEGATIVE 03/19/2023 1539   UROBILINOGEN 0.2 03/11/2023 1419   NITRITE NEGATIVE 03/19/2023 1539   LEUKOCYTESUR NEGATIVE 03/19/2023 1539   Sepsis Labs: @LABRCNTIP (procalcitonin:4,lacticidven:4)  ) Recent Results (from the past 240 hour(s))  MRSA Next Gen by PCR, Nasal     Status: None   Collection Time: 03/20/23  3:30 PM   Specimen: Nasal Mucosa; Nasal Swab  Result Value Ref Range Status   MRSA by PCR Next Gen NOT DETECTED NOT DETECTED Final    Comment: (NOTE) The GeneXpert MRSA Assay (FDA approved for NASAL specimens only), is one component of a comprehensive MRSA colonization surveillance program. It is not intended to diagnose MRSA infection nor to guide or monitor treatment for MRSA infections. Test performance is not FDA approved in patients less than 38 years old. Performed at [facility], [address], [place], [place] [address]    Body fluid culture w Gram Stain     Status: None (Preliminary result)   Collection Time: 03/20/23  3:40 PM   Specimen: PATH Cytology Misc. fluid; Body Fluid  Result Value Ref Range Status   Specimen Description FLUID PERITONEAL  Final   Special Requests NONE  Final   Gram Stain NO WBC SEEN NO ORGANISMS SEEN   Final   Culture   Final    NO GROWTH < 24 HOURS Performed at [facility], [address], [place], [place] [address]    Report Status PENDING  Incomplete      Studies: No results found.  Scheduled Meds:  albuterol  2.5 mg Nebulization Q6H   Chlorhexidine Gluconate Cloth  6 each Topical Daily   colchicine  0.6 mg Oral Daily   enoxaparin (LOVENOX) injection  50 mg Subcutaneous Q24H   indomethacin  75 mg Oral Daily   insulin aspart  0-15 Units Subcutaneous Q4H   lidocaine  1 patch Transdermal Once   sodium chloride flush  3 mL Intravenous Q12H   sodium chloride flush  3 mL Intravenous Q12H    Continuous Infusions:  0.9 % NaCl with KCl 20 mEq / L Stopped (03/21/23 1136)  LOS: 1 day     Briant Cedar, MD [facility]  If 7PM-7AM, please contact night-coverage [email or web address] 03/21/2023, 3:29 PM

## 2023-03-21 NOTE — Plan of Care (Signed)

## 2023-03-21 NOTE — Consult Note (Signed)
Ref: Avis Epley, PA-C   Subjective:  Feeling better. S/P pericardila drain for large pericardial effusion.  Objective:  Vital Signs in the last 24 hours: Temp:  [97.8 F (36.6 C)-98.3 F (36.8 C)] 97.9 F (36.6 C) (09/11 1951) Pulse Rate:  [77-96] 84 (09/11 2000) Cardiac Rhythm: Normal sinus rhythm (09/11 2100) Resp:  [15-31] 21 (09/11 2000) BP: (100-147)/(65-91) 147/78 (09/11 2000) SpO2:  [91 %-100 %] 99 % (09/11 2000) FiO2 (%):  [21 %] 21 % (09/11 1951) Weight:  [109.5 kg] 109.5 kg (09/11 0451)  Physical Exam: BP Readings from Last 1 Encounters:  03/21/23 (!) 147/78     Wt Readings from Last 1 Encounters:  03/21/23 109.5 kg    Weight change: 2.49 kg Body mass index is 38.96 kg/m. HEENT: [place]/AT, Eyes-Brown,  Conjunctiva-Pink, Sclera-Non-icteric Neck: No JVD, No bruit, Trachea midline. Lungs:  Clear, Bilateral. Cardiac:  Regular rhythm, normal S1 and S2, no S3. II/VI systolic murmur. Positive pericardial rub. Abdomen:  Soft, non-tender. BS present. Extremities:  No edema present. No cyanosis. No clubbing. CNS: AxOx3, Cranial nerves grossly intact, moves all 4 extremities.  Skin: Warm and dry.   Intake/Output from previous day: 09/10 0701 - 09/11 0700 In: 1740.6 [I.V.:1740.6] Out: 1955 [Urine:1800; Drains:155]    Lab Results: BMET    Component Value Date/Time   NA 137 03/21/2023 0254   NA 135 03/19/2023 1603   NA 134 (L) 09/07/2022 0929   K 3.9 03/21/2023 0254   K 3.9 03/19/2023 1603   K 4.5 09/07/2022 0929   CL 101 03/21/2023 0254   CL 100 03/19/2023 1603   CL 100 09/07/2022 0929   CO2 23 03/21/2023 0254   CO2 27 03/19/2023 1603   CO2 25 09/07/2022 0929   GLUCOSE 149 (H) 03/21/2023 0254   GLUCOSE 102 (H) 03/19/2023 1603   GLUCOSE 105 (H) 09/07/2022 0929   BUN 8 03/21/2023 0254   BUN 9 03/19/2023 1603   BUN 10 09/07/2022 0929   CREATININE 0.91 03/21/2023 0254   CREATININE 0.81 03/19/2023 1603   CREATININE 0.92 09/07/2022 0929   CALCIUM 8.4  (L) 03/21/2023 0254   CALCIUM 8.9 03/19/2023 1603   CALCIUM 9.3 09/07/2022 0929   GFRNONAA >60 03/21/2023 0254   GFRNONAA >60 03/19/2023 1603   GFRNONAA >60 09/07/2022 0929   GFRAA >60 12/03/2019 2315   GFRAA >90 09/19/2011 0849   GFRAA  06/16/2010 1237    >60        The eGFR has been calculated using the MDRD equation. This calculation has not been validated in all clinical situations. eGFR's persistently <60 mL/min signify possible Chronic Kidney Disease.   CBC    Component Value Date/Time   WBC 7.3 03/21/2023 0254   RBC 3.50 (L) 03/21/2023 0254   HGB 10.6 (L) 03/21/2023 0254   HCT 34.6 (L) 03/21/2023 0254   PLT 342 03/21/2023 0254   MCV 98.9 03/21/2023 0254   MCH 30.3 03/21/2023 0254   MCHC 30.6 03/21/2023 0254   RDW 13.0 03/21/2023 0254   LYMPHSABS 3.5 12/03/2019 2315   MONOABS 0.8 12/03/2019 2315   EOSABS 0.3 12/03/2019 2315   BASOSABS 0.1 12/03/2019 2315   HEPATIC Function Panel No results for input(s): "PROT", "ALBUMIN", "AST", "ALT", "ALKPHOS", "BILIDIR", "IBILI" in the last 8760 hours. HEMOGLOBIN A1C Lab Results  Component Value Date   MPG 140 03/20/2023   CARDIAC ENZYMES No results found for: "CKTOTAL", "CKMB", "CKMBINDEX", "TROPONINI" BNP No results for input(s): "PROBNP" in the last 8760 hours. TSH  No results for input(s): "TSH" in the last 8760 hours. CHOLESTEROL No results for input(s): "CHOL" in the last 8760 hours.  Scheduled Meds:  albuterol  2.5 mg Nebulization Q6H   Chlorhexidine Gluconate Cloth  6 each Topical Daily   colchicine  0.6 mg Oral Daily   enoxaparin (LOVENOX) injection  50 mg Subcutaneous Q24H   indomethacin  75 mg Oral Daily   insulin aspart  0-15 Units Subcutaneous Q4H   lidocaine  1 patch Transdermal Once   sodium chloride flush  3 mL Intravenous Q12H   sodium chloride flush  3 mL Intravenous Q12H   Continuous Infusions: PRN Meds:.acetaminophen **OR** acetaminophen, HYDROmorphone (DILAUDID) injection, ondansetron  (ZOFRAN) IV, mouth rinse  Assessment/Plan: Large pericardial effusion S/P pericardial drain S/P right breast cancer  HTN Type 2 DM  Plan: Continue pericardial drain. Continue colchicine and Indomethacin.   LOS: 1 day   Time spent including chart review, lab review, examination, discussion with patient/Nurse : 30 min   Orpah Cobb  MD  03/21/2023, 10:03 PM

## 2023-03-22 ENCOUNTER — Inpatient Hospital Stay (HOSPITAL_COMMUNITY): Payer: 59

## 2023-03-22 DIAGNOSIS — I3139 Other pericardial effusion (noninflammatory): Secondary | ICD-10-CM | POA: Diagnosis not present

## 2023-03-22 LAB — CBC
HCT: 33 % — ABNORMAL LOW (ref 36.0–46.0)
Hemoglobin: 10.4 g/dL — ABNORMAL LOW (ref 12.0–15.0)
MCH: 28.6 pg (ref 26.0–34.0)
MCHC: 31.5 g/dL (ref 30.0–36.0)
MCV: 90.7 fL (ref 80.0–100.0)
Platelets: 444 10*3/uL — ABNORMAL HIGH (ref 150–400)
RBC: 3.64 MIL/uL — ABNORMAL LOW (ref 3.87–5.11)
RDW: 12.9 % (ref 11.5–15.5)
WBC: 5.8 10*3/uL (ref 4.0–10.5)
nRBC: 0 % (ref 0.0–0.2)

## 2023-03-22 LAB — BASIC METABOLIC PANEL
Anion gap: 11 (ref 5–15)
BUN: 5 mg/dL — ABNORMAL LOW (ref 6–20)
CO2: 22 mmol/L (ref 22–32)
Calcium: 8.5 mg/dL — ABNORMAL LOW (ref 8.9–10.3)
Chloride: 103 mmol/L (ref 98–111)
Creatinine, Ser: 0.85 mg/dL (ref 0.44–1.00)
GFR, Estimated: >60 mL/min (ref >60)
Glucose, Bld: 117 mg/dL — ABNORMAL HIGH (ref 70–99)
Potassium: 4 mmol/L (ref 3.5–5.1)
Sodium: 136 mmol/L (ref 135–145)

## 2023-03-22 LAB — GLUCOSE, CAPILLARY
Glucose-Capillary: 120 mg/dL — ABNORMAL HIGH (ref 70–99)
Glucose-Capillary: 164 mg/dL — ABNORMAL HIGH (ref 70–99)
Glucose-Capillary: 85 mg/dL (ref 70–99)
Glucose-Capillary: 149 mg/dL — ABNORMAL HIGH (ref 70–99)
Glucose-Capillary: 94 mg/dL (ref 70–99)
Glucose-Capillary: 152 mg/dL — ABNORMAL HIGH (ref 70–99)

## 2023-03-22 LAB — ECHOCARDIOGRAM LIMITED
Height: 66 in
Weight: 3827.19 [oz_av]

## 2023-03-22 LAB — CYTOLOGY - NON PAP

## 2023-03-22 MED ORDER — ALBUTEROL SULFATE (2.5 MG/3ML) 0.083% IN NEBU
2.5000 mg | INHALATION_SOLUTION | Freq: Two times a day (BID) | RESPIRATORY_TRACT | Status: DC
  Administered 2023-03-22 – 2023-03-24 (×5): 2.5 mg via RESPIRATORY_TRACT
  Filled 2023-03-22 (×5): qty 3

## 2023-03-22 NOTE — Consult Note (Signed)
Ref: Avis Epley, PA-C   Subjective:  VS stable. No chest pain. 300 cc drain output per nurse Echocardiogram without recurrence  of pericardial effusion under drain use.  Objective:  Vital Signs in the last 24 hours: Temp:  [97.7 F (36.5 C)-98.1 F (36.7 C)] 98.1 F (36.7 C) (09/12 1130) Pulse Rate:  [77-94] 91 (09/12 1100) Cardiac Rhythm: Normal sinus rhythm (09/12 1100) Resp:  [15-31] 18 (09/12 1100) BP: (117-147)/(65-94) 142/84 (09/12 1100) SpO2:  [96 %-100 %] 98 % (09/12 1100) FiO2 (%):  [21 %] 21 % (09/11 1951) Weight:  [108.5 kg] 108.5 kg (09/12 0500)  Physical Exam: BP Readings from Last 1 Encounters:  03/22/23 (!) 142/84     Wt Readings from Last 1 Encounters:  03/22/23 108.5 kg    Weight change: -2.4 kg Body mass index is 38.61 kg/m. HEENT: [place]/AT, Eyes-Brown, Conjunctiva-Pink, Sclera-Non-icteric Neck: No JVD, No bruit, Trachea midline. Lungs:  Clear, Bilateral. Cardiac:  Regular rhythm, normal S1 and S2, no S3. II/VI systolic murmur. Positive pericardial rub. Abdomen:  Soft, non-tender. BS present. Extremities:  No edema present. No cyanosis. No clubbing. CNS: AxOx3, Cranial nerves grossly intact, moves all 4 extremities.  Skin: Warm and dry.   Intake/Output from previous day: 09/11 0701 - 09/12 0700 In: 1788 [P.O.:1200; I.V.:588] Out: 300 [Drains:300]    Lab Results: BMET    Component Value Date/Time   NA 136 03/22/2023 0627   NA 137 03/21/2023 0254   NA 135 03/19/2023 1603   K 4.0 03/22/2023 0627   K 3.9 03/21/2023 0254   K 3.9 03/19/2023 1603   CL 103 03/22/2023 0627   CL 101 03/21/2023 0254   CL 100 03/19/2023 1603   CO2 22 03/22/2023 0627   CO2 23 03/21/2023 0254   CO2 27 03/19/2023 1603   GLUCOSE 117 (H) 03/22/2023 0627   GLUCOSE 149 (H) 03/21/2023 0254   GLUCOSE 102 (H) 03/19/2023 1603   BUN 5 (L) 03/22/2023 0627   BUN 8 03/21/2023 0254   BUN 9 03/19/2023 1603   CREATININE 0.85 03/22/2023 0627   CREATININE 0.91 03/21/2023  0254   CREATININE 0.81 03/19/2023 1603   CALCIUM 8.5 (L) 03/22/2023 0627   CALCIUM 8.4 (L) 03/21/2023 0254   CALCIUM 8.9 03/19/2023 1603   GFRNONAA >60 03/22/2023 0627   GFRNONAA >60 03/21/2023 0254   GFRNONAA >60 03/19/2023 1603   GFRAA >60 12/03/2019 2315   GFRAA >90 09/19/2011 0849   GFRAA  06/16/2010 1237    >60        The eGFR has been calculated using the MDRD equation. This calculation has not been validated in all clinical situations. eGFR's persistently <60 mL/min signify possible Chronic Kidney Disease.   CBC    Component Value Date/Time   WBC 5.8 03/22/2023 0627   RBC 3.64 (L) 03/22/2023 0627   HGB 10.4 (L) 03/22/2023 0627   HCT 33.0 (L) 03/22/2023 0627   PLT 444 (H) 03/22/2023 0627   MCV 90.7 03/22/2023 0627   MCH 28.6 03/22/2023 0627   MCHC 31.5 03/22/2023 0627   RDW 12.9 03/22/2023 0627   LYMPHSABS 3.5 12/03/2019 2315   MONOABS 0.8 12/03/2019 2315   EOSABS 0.3 12/03/2019 2315   BASOSABS 0.1 12/03/2019 2315   HEPATIC Function Panel No results for input(s): "PROT", "ALBUMIN", "AST", "ALT", "ALKPHOS", "BILIDIR", "IBILI" in the last 8760 hours. HEMOGLOBIN A1C Lab Results  Component Value Date   MPG 140 03/20/2023   CARDIAC ENZYMES No results found for: "CKTOTAL", "CKMB", "CKMBINDEX", "  TROPONINI" BNP No results for input(s): "PROBNP" in the last 8760 hours. TSH No results for input(s): "TSH" in the last 8760 hours. CHOLESTEROL No results for input(s): "CHOL" in the last 8760 hours.  Scheduled Meds:  albuterol  2.5 mg Nebulization BID   Chlorhexidine Gluconate Cloth  6 each Topical Daily   colchicine  0.6 mg Oral Daily   enoxaparin (LOVENOX) injection  50 mg Subcutaneous Q24H   indomethacin  75 mg Oral Daily   insulin aspart  0-15 Units Subcutaneous Q4H   lidocaine  1 patch Transdermal Once   sodium chloride flush  3 mL Intravenous Q12H   sodium chloride flush  3 mL Intravenous Q12H   Continuous Infusions: PRN Meds:.acetaminophen **OR**  acetaminophen, HYDROmorphone (DILAUDID) injection, ondansetron (ZOFRAN) IV, mouth rinse  Assessment/Plan: Large pericardial effusion with tamponade S/P pericardiocentesis and drain S/P right breast cancer HTN Type 2 DM  Plan: Notify lab to correct fluid labeling as pericardial and not peritoneal Remove drain when less than 30-45 cc drain per shift.   LOS: 2 days   Time spent including chart review, lab review, examination, discussion with patient/Nurse : 30 min   Orpah Cobb  MD  03/22/2023, 12:53 PM

## 2023-03-22 NOTE — Progress Notes (Signed)
PROGRESS NOTE  Tonya Calhoun OZD:[id number] DOB: [birth year] DOA: 03/19/2023 PCP: Avis Epley, PA-C  HPI/Recap of past 24 hours: Tonya Calhoun is a 57 y.o. 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 In the ED, VSS except for 87% on 2 L of nasal cannula oxygen. CT of the chest was subsequently done which confirmed large hyperdense pericardial effusion suggesting proteinaceous or hemorrhagic fluid.  Cardiology consulted s/p pericardiocentesis. Pt admitted for further management.    Today, pt denies any new complaints. Felt emotional and overwhelmed about ongoing medical condition     Assessment/Plan: Principal Problem:   Pericardial effusion Active Problems:   Normocytic anemia   Essential hypertension   History of breast cancer   Obesity (BMI 30-39.9)   Large Pericardial effusion Noted on CT chest and confirmed with ECHO Cardiology Dr Algie Coffer consulted, s/p pericardiocentesis on 9/10, with 1100cc of hemorrhagic fluid drainage  ECHO post procedure showed EF of 60-65%, resolved cardiac tamponade  Continue drain care: As per cardiology: Remove drain when less than 30-45 cc drain per shift Monitor closely, further management per cardiology   Normocytic anemia Hemoglobin around baseline Daily CBC   Essential hypertension Blood pressures are currently maintained. 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 Continue outpatient follow-up with hematology oncology   Obesity BMI 39.46 kg/m    Estimated body mass index is 38.61 kg/m as calculated from the following:   Height as of this encounter: 5\' 6"  (1.676 m).   Weight as of this  encounter: 108.5 kg.     Code Status: Full  Family Communication: None at bedside  Disposition Plan: Status is: Inpatient Remains inpatient appropriate because: Level of care      Consultants: Cardiology  Procedures: Pericardiocentesis  Antimicrobials: None  DVT prophylaxis: Lovenox   Objective: Vitals:   03/22/23 1400 03/22/23 1500 03/22/23 1600 03/22/23 1700  BP: 139/82 139/81 128/76 133/79  Pulse: 81 83 78 87  Resp:    20  Temp:   97.7 F (36.5 C)   TempSrc:   Axillary   SpO2: 97% 100% 97% 100%  Weight:      Height:        Intake/Output Summary (Last 24 hours) at 03/22/2023 1725 Last data filed at 03/22/2023 1627 Gross per 24 hour  Intake 903.73 ml  Output 150 ml  Net 753.73 ml   Filed Weights   03/20/23 1630 03/21/23 0451 03/22/23 0500  Weight: 109.7 kg 109.5 kg 108.5 kg    Exam: General: NAD  Cardiovascular: S1, S2 present, drain noted Respiratory: CTAB Abdomen: Soft, nontender, nondistended, bowel sounds present Musculoskeletal: No bilateral pedal edema noted Skin: Normal Psychiatry: Normal mood     Data Reviewed: CBC: Recent Labs  Lab 03/19/23 1603 03/21/23 0254 03/22/23 0627  WBC 6.8 7.3 5.8  HGB 10.5* 10.6* 10.4*  HCT 33.1* 34.6* 33.0*  MCV 92.2 98.9 90.7  PLT 462* 342 444*   Basic Metabolic Panel: Recent Labs  Lab 03/19/23 1603 03/21/23 0254 03/22/23 0627  NA 135 137 136  K 3.9 3.9 4.0  CL 100 101 103  CO2 27 23 22   GLUCOSE 102* 149* 117*  BUN 9 8 5*  CREATININE 0.81 0.91 0.85  CALCIUM 8.9 8.4* 8.5*   GFR: Estimated Creatinine Clearance: 94.4 mL/min (by C-G formula based on SCr of 0.85 mg/dL). Liver Function Tests: No results for input(s): "AST", "ALT", "ALKPHOS", "BILITOT", "PROT", "ALBUMIN" in the last 168 hours. No results for input(s): "LIPASE", "AMYLASE" in the last 168 hours. No results for input(s): "AMMONIA" in the last 168 hours. Coagulation Profile: No results for input(s): "INR", "PROTIME" in the  last 168 hours. Cardiac Enzymes: No results for input(s): "CKTOTAL", "CKMB", "CKMBINDEX", "TROPONINI" in the last 168 hours. BNP (last 3 results) No results for input(s): "PROBNP" in the last 8760 hours. HbA1C: Recent Labs    03/20/23 0556  HGBA1C 6.5*   CBG: Recent Labs  Lab 03/21/23 2338 03/22/23 0341 03/22/23 0904 03/22/23 1139 03/22/23 1542  GLUCAP 102* 120* 164* 85 149*   Lipid Profile: No results for input(s): "CHOL", "HDL", "LDLCALC", "TRIG", "CHOLHDL", "LDLDIRECT" in the last 72 hours. Thyroid Function Tests: No results for input(s): "TSH", "T4TOTAL", "FREET4", "T3FREE", "THYROIDAB" in the last 72 hours. Anemia Panel: No results for input(s): "VITAMINB12", "FOLATE", "FERRITIN", "TIBC", "IRON", "RETICCTPCT" in the last 72 hours. Urine analysis:    Component Value Date/Time   COLORURINE YELLOW 03/19/2023 1539   APPEARANCEUR CLEAR 03/19/2023 1539   LABSPEC 1.018 03/19/2023 1539   PHURINE 5.5 03/19/2023 1539   GLUCOSEU NEGATIVE 03/19/2023 1539   HGBUR NEGATIVE 03/19/2023 1539   BILIRUBINUR NEGATIVE 03/19/2023 1539   BILIRUBINUR negative 03/11/2023 1419   KETONESUR NEGATIVE 03/19/2023 1539   PROTEINUR NEGATIVE 03/19/2023 1539   UROBILINOGEN 0.2 03/11/2023 1419   NITRITE NEGATIVE 03/19/2023 1539   LEUKOCYTESUR NEGATIVE 03/19/2023 1539   Sepsis Labs: @LABRCNTIP (procalcitonin:4,lacticidven:4)  ) Recent Results (from the past 240 hour(s))  MRSA Next Gen by PCR, Nasal     Status: None   Collection Time: 03/20/23  3:30 PM   Specimen: Nasal Mucosa; Nasal Swab  Result Value Ref Range Status   MRSA by PCR Next Gen NOT DETECTED NOT DETECTED Final    Comment: (NOTE) The GeneXpert MRSA Assay (FDA approved for NASAL specimens only), is one component of a comprehensive MRSA colonization surveillance program. It is not intended to diagnose MRSA infection nor to guide or monitor treatment for MRSA infections. Test performance is not FDA approved in patients less than 90  years old. Performed at [facility], [address], [place], [place] [address]   Body fluid culture w Gram Stain     Status: None (Preliminary result)   Collection Time: 03/20/23  3:40 PM   Specimen: PATH Cytology Misc. fluid; Body Fluid  Result Value Ref Range Status   Specimen Description FLUID PERITONEAL  Final   Special Requests NONE  Final   Gram Stain NO WBC SEEN NO ORGANISMS SEEN   Final   Culture   Final    NO GROWTH 2 DAYS Performed at [facility], [address] [place], [place] [address]    Report Status PENDING  Incomplete      Studies: ECHOCARDIOGRAM LIMITED  Result Date: 03/22/2023    ECHOCARDIOGRAM LIMITED REPORT   Patient Name:   Tonya Calhoun Date of Exam: 03/22/2023 Medical Rec #:  [id number]           Height:       66.0 in Accession #:    [id number]          Weight:       239.2 lb Date of Birth:  Jul 18, 1965  BSA:          2.158 m Patient Age:    56 years            BP:           142/82 mmHg Patient Gender: F                   HR:           91 bpm. Exam Location:  Inpatient Procedure: Limited Echo Indications:     Pericardial effusion I31.3  History:         Patient has prior history of Echocardiogram examinations, most                  recent 03/20/2023. Pericardial Disease, Breast Cancer; Risk                  Factors:Hypertension, Non-Smoker and Diabetes.  Sonographer:     Aron Baba Referring Phys:  [id number] Orpah Cobb Diagnosing Phys: Orpah Cobb MD IMPRESSIONS  1. Left ventricular ejection fraction, by estimation, is 55 to 60%. The left ventricle has normal function. The left ventricle has no regional wall motion abnormalities.  2. Right ventricular systolic function is normal.  3. Left atrial size was mildly dilated.  4. Right atrial size was mildly dilated.  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  6. The aortic valve is normal in structure.  7. The inferior vena cava is dilated in size with >50% respiratory variability, suggesting right  atrial pressure of 8 mmHg. FINDINGS  Left Ventricle: Left ventricular ejection fraction, by estimation, is 55 to 60%. The left ventricle has normal function. The left ventricle has no regional wall motion abnormalities. Right Ventricle: Right ventricular systolic function is normal. Left Atrium: Left atrial size was mildly dilated. Right Atrium: Right atrial size was mildly dilated. Pericardium: No reaccumulation of pericardial effusion under drainage. There is no evidence of pericardial effusion. 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 Tricuspid Valve: The tricuspid valve is normal in structure. Aortic Valve: The aortic valve is normal in structure. Pulmonic Valve: The pulmonic valve was not well visualized. Aorta: The aortic root is normal in size and structure. Venous: The inferior vena cava is dilated in size with greater than 50% respiratory variability, suggesting right atrial pressure of 8 mmHg. Orpah Cobb MD Electronically signed by Orpah Cobb MD Signature Date/Time: 03/22/2023/12:52:18 PM    Final     Scheduled Meds:  albuterol  2.5 mg Nebulization BID   Chlorhexidine Gluconate Cloth  6 each Topical Daily   colchicine  0.6 mg Oral Daily   enoxaparin (LOVENOX) injection  50 mg Subcutaneous Q24H   indomethacin  75 mg Oral Daily   insulin aspart  0-15 Units Subcutaneous Q4H   lidocaine  1 patch Transdermal Once   sodium chloride flush  3 mL Intravenous Q12H   sodium chloride flush  3 mL Intravenous Q12H    Continuous Infusions:     LOS: 2 days     Briant Cedar, MD [facility]  If 7PM-7AM, please contact night-coverage [email or web address] 03/22/2023, 5:25 PM

## 2023-03-22 NOTE — Progress Notes (Signed)
  Echocardiogram 2D Echocardiogram has been performed.  Maren Reamer 03/22/2023, 12:07 PM

## 2023-03-22 NOTE — Plan of Care (Signed)

## 2023-03-23 DIAGNOSIS — I3139 Other pericardial effusion (noninflammatory): Secondary | ICD-10-CM | POA: Diagnosis not present

## 2023-03-23 LAB — BODY FLUID CELL COUNT WITH DIFFERENTIAL
Eos, Fluid: 4 %
Lymphs, Fluid: 14 %
Monocyte-Macrophage-Serous Fluid: 65 % (ref 50–90)
Neutrophil Count, Fluid: 17 % (ref 0–25)
Total Nucleated Cell Count, Fluid: 3540 uL — ABNORMAL HIGH (ref 0–1000)

## 2023-03-23 LAB — GLUCOSE, CAPILLARY
Glucose-Capillary: 95 mg/dL (ref 70–99)
Glucose-Capillary: 168 mg/dL — ABNORMAL HIGH (ref 70–99)
Glucose-Capillary: 102 mg/dL — ABNORMAL HIGH (ref 70–99)
Glucose-Capillary: 75 mg/dL (ref 70–99)
Glucose-Capillary: 138 mg/dL — ABNORMAL HIGH (ref 70–99)

## 2023-03-23 LAB — PROTEIN, BODY FLUID (OTHER): Total Protein, Body Fluid Other: 5.3 g/dL

## 2023-03-23 LAB — BODY FLUID CULTURE W GRAM STAIN
Culture: NO GROWTH
Gram Stain: NONE SEEN

## 2023-03-23 LAB — LD, BODY FLUID (OTHER): LD, Body Fluid: 463 IU/L

## 2023-03-23 NOTE — Plan of Care (Signed)
  Problem: Metabolic: Goal: Ability to maintain appropriate glucose levels will improve Outcome: Progressing   Problem: Nutritional: Goal: Maintenance of adequate nutrition will improve Outcome: Progressing   Problem: Skin Integrity: Goal: Risk for impaired skin integrity will decrease Outcome: Progressing   Problem: Tissue Perfusion: Goal: Adequacy of tissue perfusion will improve Outcome: Progressing   Problem: Clinical Measurements: Goal: Will remain free from infection Outcome: Progressing Goal: Respiratory complications will improve Outcome: Progressing Goal: Cardiovascular complication will be avoided Outcome: Progressing   Problem: Activity: Goal: Risk for activity intolerance will decrease Outcome: Progressing   Problem: Nutrition: Goal: Adequate nutrition will be maintained Outcome: Progressing   Problem: Coping: Goal: Level of anxiety will decrease Outcome: Progressing   Problem: Elimination: Goal: Will not experience complications related to bowel motility Outcome: Progressing Goal: Will not experience complications related to urinary retention Outcome: Progressing   Problem: Pain Managment: Goal: General experience of comfort will improve Outcome: Progressing

## 2023-03-23 NOTE — Plan of Care (Signed)

## 2023-03-23 NOTE — Progress Notes (Signed)
PROGRESS NOTE  Tonya Calhoun MVH:[id number] DOB: 1966/02/12 DOA: 03/19/2023 PCP: Tonya Epley, PA-C  HPI/Recap of past 24 hours: Tonya Calhoun is a 57 y.o. 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 In the ED, VSS except for 87% on 2 L of nasal cannula oxygen. CT of the chest was subsequently done which confirmed large hyperdense pericardial effusion suggesting proteinaceous or hemorrhagic fluid.  Cardiology consulted s/p pericardiocentesis. Pt admitted for further management.    Today, patient denies any new complaints.    Assessment/Plan: Principal Problem:   Pericardial effusion Active Problems:   Normocytic anemia   Essential hypertension   History of breast cancer   Obesity (BMI 30-39.9)   Large Pericardial effusion s/p pericardiocentesis on 9/10 Noted on CT chest and confirmed with ECHO Cardiology Dr Algie Coffer consulted, s/p pericardiocentesis on 9/10, with 1100cc of hemorrhagic fluid drainage  ECHO post procedure showed EF of 60-65%, resolved cardiac tamponade  Removed drain as per cardiology on 9/13 Continue colchicine for about 3 months and indomethacin for about 1 month Monitor closely   Normocytic anemia Hemoglobin around baseline Daily CBC   Essential hypertension Blood pressures are currently maintained. 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 Continue outpatient follow-up with hematology oncology   Obesity BMI 39.46 kg/m    Estimated body mass index is 38.36 kg/m as calculated from the following:   Height as of this encounter: 5\' 6"  (1.676 m).   Weight as of this encounter: 107.8 kg.     Code Status:  Full  Family Communication: None at bedside  Disposition Plan: Status is: Inpatient Remains inpatient appropriate because: Level of care      Consultants: Cardiology  Procedures: Pericardiocentesis  Antimicrobials: None  DVT prophylaxis: Lovenox   Objective: Vitals:   03/23/23 0800 03/23/23 0912 03/23/23 1100 03/23/23 1229  BP: 109/76   115/78  Pulse: 84   79  Resp: 18   18  Temp: 97.6 F (36.4 C)  (!) 97.2 F (36.2 C) 97.7 F (36.5 C)  TempSrc: Oral  Oral Oral  SpO2: 96% 96%  99%  Weight:      Height:        Intake/Output Summary (Last 24 hours) at 03/23/2023 1554 Last data filed at 03/23/2023 0800 Gross per 24 hour  Intake 120 ml  Output 62 ml  Net 58 ml   Filed Weights   03/21/23 0451 03/22/23 0500 03/23/23 0600  Weight: 109.5 kg 108.5 kg 107.8 kg    Exam: General: NAD  Cardiovascular: S1, S2 present, drain noted Respiratory: CTAB Abdomen: Soft, nontender, nondistended, bowel sounds present Musculoskeletal: No bilateral pedal edema noted Skin: Normal Psychiatry: Normal mood     Data Reviewed: CBC: Recent Labs  Lab 03/19/23 1603 03/21/23 0254 03/22/23 0627  WBC 6.8 7.3 5.8  HGB 10.5* 10.6* 10.4*  HCT 33.1* 34.6* 33.0*  MCV 92.2 98.9 90.7  PLT 462* 342 444*   Basic Metabolic Panel: Recent Labs  Lab 03/19/23 1603 03/21/23 0254 03/22/23 0627  NA 135 137 136  K 3.9 3.9 4.0  CL 100 101 103  CO2 27 23 22   GLUCOSE 102* 149* 117*  BUN 9 8 5*  CREATININE 0.81 0.91 0.85  CALCIUM 8.9 8.4* 8.5*   GFR: Estimated Creatinine Clearance: 94 mL/min (by C-G formula based on SCr of 0.85 mg/dL). Liver Function Tests: No results for input(s): "AST", "ALT", "ALKPHOS", "BILITOT", "PROT", "ALBUMIN" in the last 168 hours. No results for input(s): "LIPASE", "AMYLASE" in the last 168 hours. No results for input(s): "AMMONIA" in the last 168 hours. Coagulation Profile: No results for input(s): "INR", "PROTIME" in the last 168 hours. Cardiac  Enzymes: No results for input(s): "CKTOTAL", "CKMB", "CKMBINDEX", "TROPONINI" in the last 168 hours. BNP (last 3 results) No results for input(s): "PROBNP" in the last 8760 hours. HbA1C: No results for input(s): "HGBA1C" in the last 72 hours.  CBG: Recent Labs  Lab 03/22/23 2023 03/22/23 2327 03/23/23 0331 03/23/23 0833 03/23/23 1201  GLUCAP 94 152* 95 168* 102*   Lipid Profile: No results for input(s): "CHOL", "HDL", "LDLCALC", "TRIG", "CHOLHDL", "LDLDIRECT" in the last 72 hours. Thyroid Function Tests: No results for input(s): "TSH", "T4TOTAL", "FREET4", "T3FREE", "THYROIDAB" in the last 72 hours. Anemia Panel: No results for input(s): "VITAMINB12", "FOLATE", "FERRITIN", "TIBC", "IRON", "RETICCTPCT" in the last 72 hours. Urine analysis:    Component Value Date/Time   COLORURINE YELLOW 03/19/2023 1539   APPEARANCEUR CLEAR 03/19/2023 1539   LABSPEC 1.018 03/19/2023 1539   PHURINE 5.5 03/19/2023 1539   GLUCOSEU NEGATIVE 03/19/2023 1539   HGBUR NEGATIVE 03/19/2023 1539   BILIRUBINUR NEGATIVE 03/19/2023 1539   BILIRUBINUR negative 03/11/2023 1419   KETONESUR NEGATIVE 03/19/2023 1539   PROTEINUR NEGATIVE 03/19/2023 1539   UROBILINOGEN 0.2 03/11/2023 1419   NITRITE NEGATIVE 03/19/2023 1539   LEUKOCYTESUR NEGATIVE 03/19/2023 1539   Sepsis Labs: @LABRCNTIP (procalcitonin:4,lacticidven:4)  ) Recent Results (from the past 240 hour(s))  MRSA Next Gen by PCR, Nasal     Status: None   Collection Time: 03/20/23  3:30 PM   Specimen: Nasal Mucosa; Nasal Swab  Result Value Ref Range Status   MRSA by PCR Next Gen NOT DETECTED NOT DETECTED Final    Comment: (NOTE) The GeneXpert MRSA Assay (FDA approved for NASAL specimens only), is one component of a comprehensive MRSA colonization surveillance program. It is not intended to diagnose MRSA infection nor to guide or monitor treatment for MRSA infections. Test performance is not FDA approved in patients less than 53  years old. Performed at [facility], [address], [place], [place] [address]   Body fluid culture w Gram Stain     Status: None   Collection Time: 03/20/23  3:40 PM   Specimen: PATH Cytology Misc. fluid; Body Fluid  Result Value Ref Range Status   Specimen Description FLUID PERICARDIAL  Final   Special Requests NONE  Final   Gram Stain NO WBC SEEN NO ORGANISMS SEEN   Final   Culture   Final    NO GROWTH 3 DAYS Performed at [facility], [address], [place], [place] [address]    Report Status 03/23/2023 FINAL  Final      Studies: No results found.  Scheduled Meds:  albuterol  2.5 mg Nebulization BID   Chlorhexidine Gluconate Cloth  6 each Topical Daily   colchicine  0.6 mg Oral Daily   enoxaparin (LOVENOX) injection  50 mg Subcutaneous Q24H   indomethacin  75 mg Oral Daily   insulin aspart  0-15 Units Subcutaneous Q4H   lidocaine  1 patch Transdermal Once   sodium chloride flush  3 mL Intravenous Q12H   sodium chloride flush  3 mL Intravenous Q12H  Continuous Infusions:     LOS: 3 days     Briant Cedar, MD [facility]  If 7PM-7AM, please contact night-coverage [email or web address] 03/23/2023, 3:54 PM

## 2023-03-23 NOTE — Consult Note (Signed)
Ref: Avis Epley, PA-C   Subjective:  Less than 30 cc out put from pericardial drain. VS stable. No chest pain.  Objective:  Vital Signs in the last 24 hours: Temp:  [97.6 F (36.4 C)-98.7 F (37.1 C)] 97.6 F (36.4 C) (09/13 0800) Pulse Rate:  [78-93] 84 (09/13 0800) Cardiac Rhythm: Normal sinus rhythm (09/13 0800) Resp:  [18-20] 18 (09/13 0800) BP: (107-146)/(60-98) 109/76 (09/13 0800) SpO2:  [94 %-100 %] 96 % (09/13 0912) Weight:  [107.8 kg] 107.8 kg (09/13 0600)  Physical Exam: BP Readings from Last 1 Encounters:  03/23/23 109/76     Wt Readings from Last 1 Encounters:  03/23/23 107.8 kg    Weight change: -0.7 kg Body mass index is 38.36 kg/m. HEENT: /AT, Eyes-Brown, Conjunctiva-Pink, Sclera-Non-icteric Neck: No JVD, No bruit, Trachea midline. Lungs:  Clear, Bilateral. Cardiac:  Regular rhythm, normal S1 and S2, no S3. II/VI systolic murmur. Abdomen:  Soft, non-tender. BS present. Extremities:  No edema present. No cyanosis. No clubbing. CNS: AxOx3, Cranial nerves grossly intact, moves all 4 extremities.  Skin: Warm and dry.   Intake/Output from previous day: 09/12 0701 - 09/13 0700 In: 120 [P.O.:120] Out: 62 [Drains:62]    Lab Results: BMET    Component Value Date/Time   NA 136 03/22/2023 0627   NA 137 03/21/2023 0254   NA 135 03/19/2023 1603   K 4.0 03/22/2023 0627   K 3.9 03/21/2023 0254   K 3.9 03/19/2023 1603   CL 103 03/22/2023 0627   CL 101 03/21/2023 0254   CL 100 03/19/2023 1603   CO2 22 03/22/2023 0627   CO2 23 03/21/2023 0254   CO2 27 03/19/2023 1603   GLUCOSE 117 (H) 03/22/2023 0627   GLUCOSE 149 (H) 03/21/2023 0254   GLUCOSE 102 (H) 03/19/2023 1603   BUN 5 (L) 03/22/2023 0627   BUN 8 03/21/2023 0254   BUN 9 03/19/2023 1603   CREATININE 0.85 03/22/2023 0627   CREATININE 0.91 03/21/2023 0254   CREATININE 0.81 03/19/2023 1603   CALCIUM 8.5 (L) 03/22/2023 0627   CALCIUM 8.4 (L) 03/21/2023 0254   CALCIUM 8.9 03/19/2023 1603    GFRNONAA >60 03/22/2023 0627   GFRNONAA >60 03/21/2023 0254   GFRNONAA >60 03/19/2023 1603   GFRAA >60 12/03/2019 2315   GFRAA >90 09/19/2011 0849   GFRAA  06/16/2010 1237    >60        The eGFR has been calculated using the MDRD equation. This calculation has not been validated in all clinical situations. eGFR's persistently <60 mL/min signify possible Chronic Kidney Disease.   CBC    Component Value Date/Time   WBC 5.8 03/22/2023 0627   RBC 3.64 (L) 03/22/2023 0627   HGB 10.4 (L) 03/22/2023 0627   HCT 33.0 (L) 03/22/2023 0627   PLT 444 (H) 03/22/2023 0627   MCV 90.7 03/22/2023 0627   MCH 28.6 03/22/2023 0627   MCHC 31.5 03/22/2023 0627   RDW 12.9 03/22/2023 0627   LYMPHSABS 3.5 12/03/2019 2315   MONOABS 0.8 12/03/2019 2315   EOSABS 0.3 12/03/2019 2315   BASOSABS 0.1 12/03/2019 2315   HEPATIC Function Panel No results for input(s): "PROT", "ALBUMIN", "AST", "ALT", "ALKPHOS", "BILIDIR", "IBILI" in the last 8760 hours. HEMOGLOBIN A1C Lab Results  Component Value Date   MPG 140 03/20/2023   CARDIAC ENZYMES No results found for: "CKTOTAL", "CKMB", "CKMBINDEX", "TROPONINI" BNP No results for input(s): "PROBNP" in the last 8760 hours. TSH No results for input(s): "TSH" in the last  8760 hours. CHOLESTEROL No results for input(s): "CHOL" in the last 8760 hours.  Scheduled Meds:  albuterol  2.5 mg Nebulization BID   Chlorhexidine Gluconate Cloth  6 each Topical Daily   colchicine  0.6 mg Oral Daily   enoxaparin (LOVENOX) injection  50 mg Subcutaneous Q24H   indomethacin  75 mg Oral Daily   insulin aspart  0-15 Units Subcutaneous Q4H   lidocaine  1 patch Transdermal Once   sodium chloride flush  3 mL Intravenous Q12H   sodium chloride flush  3 mL Intravenous Q12H   Continuous Infusions: PRN Meds:.acetaminophen **OR** acetaminophen, HYDROmorphone (DILAUDID) injection, ondansetron (ZOFRAN) IV, mouth rinse  Assessment/Plan: Large pericardial effusion with  tamponade S/P pericardiocentesis and drain S/P right breast cancer HTN Type 2 DM  Plan: Removed draine and sterile dressing applied. May transfer to floor. Discharge home tomorrow if stable. F/U in 1 month.   LOS: 3 days   Time spent including chart review, lab review, examination, discussion with patient/Nurse : 30 min   Orpah Cobb  MD  03/23/2023, 9:37 AM

## 2023-03-24 ENCOUNTER — Ambulatory Visit (HOSPITAL_BASED_OUTPATIENT_CLINIC_OR_DEPARTMENT_OTHER): Payer: 59

## 2023-03-24 DIAGNOSIS — I3139 Other pericardial effusion (noninflammatory): Secondary | ICD-10-CM | POA: Diagnosis not present

## 2023-03-24 LAB — GLUCOSE, CAPILLARY
Glucose-Capillary: 122 mg/dL — ABNORMAL HIGH (ref 70–99)
Glucose-Capillary: 126 mg/dL — ABNORMAL HIGH (ref 70–99)
Glucose-Capillary: 181 mg/dL — ABNORMAL HIGH (ref 70–99)

## 2023-03-24 LAB — CBC
HCT: 37 % (ref 36.0–46.0)
Hemoglobin: 11.9 g/dL — ABNORMAL LOW (ref 12.0–15.0)
MCH: 29.2 pg (ref 26.0–34.0)
MCHC: 32.2 g/dL (ref 30.0–36.0)
MCV: 90.9 fL (ref 80.0–100.0)
Platelets: 484 10*3/uL — ABNORMAL HIGH (ref 150–400)
RBC: 4.07 MIL/uL (ref 3.87–5.11)
RDW: 13 % (ref 11.5–15.5)
WBC: 6.3 10*3/uL (ref 4.0–10.5)
nRBC: 0 % (ref 0.0–0.2)

## 2023-03-24 LAB — BASIC METABOLIC PANEL
Anion gap: 8 (ref 5–15)
BUN: 13 mg/dL (ref 6–20)
CO2: 25 mmol/L (ref 22–32)
Calcium: 9 mg/dL (ref 8.9–10.3)
Chloride: 102 mmol/L (ref 98–111)
Creatinine, Ser: 0.89 mg/dL (ref 0.44–1.00)
GFR, Estimated: >60 mL/min (ref >60)
Glucose, Bld: 120 mg/dL — ABNORMAL HIGH (ref 70–99)
Potassium: 4.4 mmol/L (ref 3.5–5.1)
Sodium: 135 mmol/L (ref 135–145)

## 2023-03-24 MED ORDER — LISINOPRIL 5 MG PO TABS
2.5000 mg | ORAL_TABLET | Freq: Every day | ORAL | Status: DC
  Administered 2023-03-24: 2.5 mg via ORAL
  Filled 2023-03-24: qty 1

## 2023-03-24 MED ORDER — INDOMETHACIN ER 75 MG PO CPCR: 75.0000 mg | ORAL_CAPSULE | Freq: Every day | ORAL | 0 refills | Status: AC

## 2023-03-24 MED ORDER — PANTOPRAZOLE SODIUM 40 MG PO TBEC: 40.0000 mg | DELAYED_RELEASE_TABLET | Freq: Every day | ORAL | 0 refills | Status: AC

## 2023-03-24 MED ORDER — DULAGLUTIDE 3 MG/0.5ML ~~LOC~~ SOAJ: 3.0000 mg | SUBCUTANEOUS | Status: DC

## 2023-03-24 MED ORDER — COLCHICINE 0.6 MG PO TABS: 0.6000 mg | ORAL_TABLET | Freq: Every day | ORAL | 0 refills | Status: AC

## 2023-03-24 MED ORDER — FERROUS SULFATE 325 (65 FE) MG PO TABS
325.0000 mg | ORAL_TABLET | Freq: Every day | ORAL | Status: DC
  Administered 2023-03-24: 325 mg via ORAL
  Filled 2023-03-24: qty 1

## 2023-03-24 MED ORDER — EPINEPHRINE 0.3 MG/0.3ML IJ SOAJ
0.3000 mg | Freq: Once | INTRAMUSCULAR | Status: DC | PRN
  Filled 2023-03-24: qty 0.3

## 2023-03-24 MED ORDER — VITAMIN D3 25 MCG (1000 UNIT) PO TABS
1000.0000 [IU] | ORAL_TABLET | Freq: Every day | ORAL | Status: DC
  Administered 2023-03-24: 1000 [IU] via ORAL
  Filled 2023-03-24 (×2): qty 1

## 2023-03-24 MED ORDER — CLOBETASOL PROPIONATE 0.05 % EX OINT: 1.0000 | TOPICAL_OINTMENT | Freq: Two times a day (BID) | CUTANEOUS | Status: DC | PRN

## 2023-03-24 NOTE — Consult Note (Signed)
Ref: Avis Epley, PA-C   Subjective:  Feeling better. No chest pain. VS stable. Home medications reviewed. She will hold ibuprofen and celecoxib.  Objective:  Vital Signs in the last 24 hours: Temp:  [97.2 F (36.2 C)-98.5 F (36.9 C)] 98.5 F (36.9 C) (09/14 0811) Pulse Rate:  [79-98] 98 (09/14 0811) Cardiac Rhythm: Normal sinus rhythm (09/14 0700) Resp:  [18-20] 18 (09/14 0811) BP: (115-138)/(75-84) 123/79 (09/14 0811) SpO2:  [98 %-99 %] 98 % (09/14 0811) Weight:  [108.2 kg] 108.2 kg (09/14 0423)  Physical Exam: BP Readings from Last 1 Encounters:  03/24/23 123/79     Wt Readings from Last 1 Encounters:  03/24/23 108.2 kg    Weight change: 0.4 kg Body mass index is 38.5 kg/m. HEENT: [place]/AT, Eyes-Brown, Conjunctiva-Pink, Sclera-Non-icteric Neck: No JVD, No bruit, Trachea midline. Lungs:  Clear, Bilateral. Cardiac:  Regular rhythm, normal S1 and S2, no S3. II/VI systolic murmur. Abdomen:  Soft, non-tender. BS present. Extremities:  No edema present. No cyanosis. No clubbing. CNS: AxOx3, Cranial nerves grossly intact, moves all 4 extremities.  Skin: Warm and dry.   Intake/Output from previous day: 09/13 0701 - 09/14 0700 In: 120 [P.O.:120] Out: -     Lab Results: BMET    Component Value Date/Time   NA 135 03/24/2023 0300   NA 136 03/22/2023 0627   NA 137 03/21/2023 0254   K 4.4 03/24/2023 0300   K 4.0 03/22/2023 0627   K 3.9 03/21/2023 0254   CL 102 03/24/2023 0300   CL 103 03/22/2023 0627   CL 101 03/21/2023 0254   CO2 25 03/24/2023 0300   CO2 22 03/22/2023 0627   CO2 23 03/21/2023 0254   GLUCOSE 120 (H) 03/24/2023 0300   GLUCOSE 117 (H) 03/22/2023 0627   GLUCOSE 149 (H) 03/21/2023 0254   BUN 13 03/24/2023 0300   BUN 5 (L) 03/22/2023 0627   BUN 8 03/21/2023 0254   CREATININE 0.89 03/24/2023 0300   CREATININE 0.85 03/22/2023 0627   CREATININE 0.91 03/21/2023 0254   CALCIUM 9.0 03/24/2023 0300   CALCIUM 8.5 (L) 03/22/2023 0627   CALCIUM  8.4 (L) 03/21/2023 0254   GFRNONAA >60 03/24/2023 0300   GFRNONAA >60 03/22/2023 0627   GFRNONAA >60 03/21/2023 0254   GFRAA >60 12/03/2019 2315   GFRAA >90 09/19/2011 0849   GFRAA  06/16/2010 1237    >60        The eGFR has been calculated using the MDRD equation. This calculation has not been validated in all clinical situations. eGFR's persistently <60 mL/min signify possible Chronic Kidney Disease.   CBC    Component Value Date/Time   WBC 6.3 03/24/2023 0300   RBC 4.07 03/24/2023 0300   HGB 11.9 (L) 03/24/2023 0300   HCT 37.0 03/24/2023 0300   PLT 484 (H) 03/24/2023 0300   MCV 90.9 03/24/2023 0300   MCH 29.2 03/24/2023 0300   MCHC 32.2 03/24/2023 0300   RDW 13.0 03/24/2023 0300   LYMPHSABS 3.5 12/03/2019 2315   MONOABS 0.8 12/03/2019 2315   EOSABS 0.3 12/03/2019 2315   BASOSABS 0.1 12/03/2019 2315   HEPATIC Function Panel No results for input(s): "PROT", "ALBUMIN", "AST", "ALT", "ALKPHOS", "BILIDIR", "IBILI" in the last 8760 hours. HEMOGLOBIN A1C Lab Results  Component Value Date   MPG 140 03/20/2023   CARDIAC ENZYMES No results found for: "CKTOTAL", "CKMB", "CKMBINDEX", "TROPONINI" BNP No results for input(s): "PROBNP" in the last 8760 hours. TSH No results for input(s): "TSH" in the last  8760 hours. CHOLESTEROL No results for input(s): "CHOL" in the last 8760 hours.  Scheduled Meds:  albuterol  2.5 mg Nebulization BID   Chlorhexidine Gluconate Cloth  6 each Topical Daily   colchicine  0.6 mg Oral Daily   Dulaglutide  3 mg Subcutaneous Q7 days   enoxaparin (LOVENOX) injection  50 mg Subcutaneous Q24H   indomethacin  75 mg Oral Daily   insulin aspart  0-15 Units Subcutaneous Q4H   Iron  1 tablet Oral Daily   lidocaine  1 patch Transdermal Once   lisinopril  2.5 mg Oral Daily   sodium chloride flush  3 mL Intravenous Q12H   sodium chloride flush  3 mL Intravenous Q12H   Vitamin D (Cholecalciferol)  1,000 Units Oral Daily   Continuous Infusions: PRN  Meds:.acetaminophen **OR** acetaminophen, clobetasol ointment, EPINEPHrine, HYDROmorphone (DILAUDID) injection, ondansetron (ZOFRAN) IV, mouth rinse  Assessment/Plan: Large pericardial effusion with tamponade S/P pericardiocentesis and drain S/P right breast cancer HTN Type 2 DM  Plan: Pericardial fluid is negative for malignant cells and culture negative.  F/U one month.   LOS: 4 days   Time spent including chart review, lab review, examination, discussion with patient/Nurse/Doctor : 30 min   Orpah Cobb  MD  03/24/2023, 10:04 AM

## 2023-03-24 NOTE — Discharge Summary (Signed)
no evidence of pericardial effusion. 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 Tricuspid Valve: The tricuspid valve is normal in structure. Aortic Valve: The aortic valve is normal in structure. Pulmonic Valve: The pulmonic valve was not well visualized. Aorta: The aortic root is normal in size and structure. Venous: The inferior vena cava is dilated in size with greater than 50% respiratory variability, suggesting right atrial pressure of 8 mmHg. Orpah Cobb MD Electronically signed by Orpah Cobb MD Signature Date/Time: 03/22/2023/12:52:18 PM    Final    ECHOCARDIOGRAM LIMITED  Result Date: 03/20/2023    ECHOCARDIOGRAM LIMITED REPORT   Patient Name:   Tonya Calhoun Date of Exam: 03/20/2023 Medical Rec #:  [id number]           Height:       66.0 in Accession #:    [id number]          Weight:       244.5 lb Date of Birth:  1966-03-27          BSA:          2.178 m Patient Age:    56 years            BP:           150/95 mmHg Patient Gender: F                   HR:           90 bpm. Exam Location:  Inpatient Procedure: Limited Echo Indications:    Pericardocentesis  History:        Patient has prior history of Echocardiogram examinations, most                 recent 03/20/2023.  Sonographer:    Darlys Gales Referring Phys: [id number] AJAY KADAKIA IMPRESSIONS  1. Left ventricular ejection fraction, by estimation, is 60 to 65%. The left ventricle has normal function. The left  ventricle has no regional wall motion abnormalities.  2. Right ventricular systolic function is normal. The right ventricular size is normal.  3. Left atrial size was mildly dilated.  4. Right atrial size was mildly dilated.  5. Post 1100 cc of hemorrhagic fluid drainage. There is no evidence of cardiac tamponade.  6. The mitral valve was not assessed.  7. The aortic valve was not assessed. FINDINGS  Left Ventricle: Left ventricular ejection fraction, by estimation, is 60 to 65%. The left ventricle has normal function. The left ventricle has no regional wall motion abnormalities. Right Ventricle: The right ventricular size is normal. No increase in right ventricular wall thickness. Right ventricular systolic function is normal. Left Atrium: Left atrial size was mildly dilated. Right Atrium: Right atrial size was mildly dilated. Pericardium: Post 1100 cc of hemorrhagic fluid drainage. Trivial pericardial effusion is present. There is no evidence of cardiac tamponade. Mitral Valve: The mitral valve was not assessed. Tricuspid Valve: The tricuspid valve is not assessed. Aortic Valve: The aortic valve was not assessed. Pulmonic Valve: The pulmonic valve was not assessed. Aorta: The aortic root was not well visualized. Venous: The inferior vena cava was not well visualized. Orpah Cobb MD Electronically signed by Orpah Cobb MD Signature Date/Time: 03/20/2023/6:17:06 PM    Final    ECHOCARDIOGRAM COMPLETE  Result Date: 03/20/2023    ECHOCARDIOGRAM REPORT   Patient Name:   Tonya Calhoun Date of Exam: 03/20/2023 Medical Rec #:  [id number]           Height:  no evidence of pericardial effusion. 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 Tricuspid Valve: The tricuspid valve is normal in structure. Aortic Valve: The aortic valve is normal in structure. Pulmonic Valve: The pulmonic valve was not well visualized. Aorta: The aortic root is normal in size and structure. Venous: The inferior vena cava is dilated in size with greater than 50% respiratory variability, suggesting right atrial pressure of 8 mmHg. Orpah Cobb MD Electronically signed by Orpah Cobb MD Signature Date/Time: 03/22/2023/12:52:18 PM    Final    ECHOCARDIOGRAM LIMITED  Result Date: 03/20/2023    ECHOCARDIOGRAM LIMITED REPORT   Patient Name:   Tonya Calhoun Date of Exam: 03/20/2023 Medical Rec #:  [id number]           Height:       66.0 in Accession #:    [id number]          Weight:       244.5 lb Date of Birth:  1966-03-27          BSA:          2.178 m Patient Age:    56 years            BP:           150/95 mmHg Patient Gender: F                   HR:           90 bpm. Exam Location:  Inpatient Procedure: Limited Echo Indications:    Pericardocentesis  History:        Patient has prior history of Echocardiogram examinations, most                 recent 03/20/2023.  Sonographer:    Darlys Gales Referring Phys: [id number] AJAY KADAKIA IMPRESSIONS  1. Left ventricular ejection fraction, by estimation, is 60 to 65%. The left ventricle has normal function. The left  ventricle has no regional wall motion abnormalities.  2. Right ventricular systolic function is normal. The right ventricular size is normal.  3. Left atrial size was mildly dilated.  4. Right atrial size was mildly dilated.  5. Post 1100 cc of hemorrhagic fluid drainage. There is no evidence of cardiac tamponade.  6. The mitral valve was not assessed.  7. The aortic valve was not assessed. FINDINGS  Left Ventricle: Left ventricular ejection fraction, by estimation, is 60 to 65%. The left ventricle has normal function. The left ventricle has no regional wall motion abnormalities. Right Ventricle: The right ventricular size is normal. No increase in right ventricular wall thickness. Right ventricular systolic function is normal. Left Atrium: Left atrial size was mildly dilated. Right Atrium: Right atrial size was mildly dilated. Pericardium: Post 1100 cc of hemorrhagic fluid drainage. Trivial pericardial effusion is present. There is no evidence of cardiac tamponade. Mitral Valve: The mitral valve was not assessed. Tricuspid Valve: The tricuspid valve is not assessed. Aortic Valve: The aortic valve was not assessed. Pulmonic Valve: The pulmonic valve was not assessed. Aorta: The aortic root was not well visualized. Venous: The inferior vena cava was not well visualized. Orpah Cobb MD Electronically signed by Orpah Cobb MD Signature Date/Time: 03/20/2023/6:17:06 PM    Final    ECHOCARDIOGRAM COMPLETE  Result Date: 03/20/2023    ECHOCARDIOGRAM REPORT   Patient Name:   Tonya Calhoun Date of Exam: 03/20/2023 Medical Rec #:  [id number]           Height:  66.0 in Accession #:    [id number]          Weight:       244.5 lb Date of Birth:  Nov 16, 1965          BSA:          2.178 m Patient Age:    56 years            BP:           147/89 mmHg Patient Gender: F                   HR:           90 bpm. Exam Location:  Inpatient Procedure: 2D Echo, Cardiac Doppler and Color Doppler Indications:     Pericardial  Effusion  History:         Patient has no prior history of Echocardiogram examinations.  Sonographer:     Darlys Gales Referring Phys:  [id number] Orpah Cobb Diagnosing Phys: Orpah Cobb MD IMPRESSIONS  1. Left ventricular ejection fraction, by estimation, is 60 to 65%. The left ventricle has normal function. The left ventricle has no regional wall motion abnormalities. Left ventricular diastolic parameters are consistent with Grade I diastolic dysfunction (impaired relaxation).  2. Moderate Diastolic collapse of RV. Right ventricular systolic function is normal. The right ventricular size is normal.  3. Left atrial size was mildly dilated.  4. Right atrial size was mildly dilated.  5. Large pericardial effusion. The pericardial effusion is circumferential.  6. The mitral valve is normal in structure. Mild mitral valve regurgitation.  7. The aortic valve is tricuspid. Aortic valve regurgitation is not visualized.  8. The inferior vena cava is dilated in size with <50% respiratory variability, suggesting right atrial pressure of 15 mmHg. Conclusion(s)/Recommendation(s): Pericardiocentesis. FINDINGS  Left Ventricle: Left ventricular ejection fraction, by estimation, is 60 to 65%. The left ventricle has normal function. The left ventricle has no regional wall motion abnormalities. The left ventricular internal cavity size was normal in size. There is  no left ventricular hypertrophy. Left ventricular diastolic parameters are consistent with Grade I diastolic dysfunction (impaired relaxation). Right Ventricle: Moderate Diastolic collapse of RV. The right ventricular size is normal. No increase in right ventricular wall thickness. Right ventricular systolic function is normal. Left Atrium: Left atrial size was mildly dilated. Right Atrium: Right atrial size was mildly dilated. Pericardium: A large pericardial effusion is present. The pericardial effusion is circumferential. The pericardial effusion appears to contain mixed  echogenic material. There is diastolic collapse of the right ventricular free wall, diastolic collapse of  the right atrial wall, excessive respiratory variation in the mitral valve spectral Doppler velocities and excessive respiratory variation in the tricuspid valve spectral Doppler velocities. Mitral Valve: The mitral valve is normal in structure. Mild mitral valve regurgitation. Tricuspid Valve: The tricuspid valve is normal in structure. Tricuspid valve regurgitation is mild. Aortic Valve: The aortic valve is tricuspid. Aortic valve regurgitation is not visualized. Pulmonic Valve: The pulmonic valve was normal in structure. Pulmonic valve regurgitation is trivial. Aorta: The aortic root is normal in size and structure. There is minimal (Grade I) atheroma plaque involving the aortic root. Venous: The inferior vena cava is dilated in size with less than 50% respiratory variability, suggesting right atrial pressure of 15 mmHg. IAS/Shunts: No atrial level shunt detected by color flow Doppler.  LEFT VENTRICLE PLAX 2D LVIDd:         4.30 cm   Diastology LVIDs:  66.0 in Accession #:    [id number]          Weight:       244.5 lb Date of Birth:  Nov 16, 1965          BSA:          2.178 m Patient Age:    56 years            BP:           147/89 mmHg Patient Gender: F                   HR:           90 bpm. Exam Location:  Inpatient Procedure: 2D Echo, Cardiac Doppler and Color Doppler Indications:     Pericardial  Effusion  History:         Patient has no prior history of Echocardiogram examinations.  Sonographer:     Darlys Gales Referring Phys:  [id number] Orpah Cobb Diagnosing Phys: Orpah Cobb MD IMPRESSIONS  1. Left ventricular ejection fraction, by estimation, is 60 to 65%. The left ventricle has normal function. The left ventricle has no regional wall motion abnormalities. Left ventricular diastolic parameters are consistent with Grade I diastolic dysfunction (impaired relaxation).  2. Moderate Diastolic collapse of RV. Right ventricular systolic function is normal. The right ventricular size is normal.  3. Left atrial size was mildly dilated.  4. Right atrial size was mildly dilated.  5. Large pericardial effusion. The pericardial effusion is circumferential.  6. The mitral valve is normal in structure. Mild mitral valve regurgitation.  7. The aortic valve is tricuspid. Aortic valve regurgitation is not visualized.  8. The inferior vena cava is dilated in size with <50% respiratory variability, suggesting right atrial pressure of 15 mmHg. Conclusion(s)/Recommendation(s): Pericardiocentesis. FINDINGS  Left Ventricle: Left ventricular ejection fraction, by estimation, is 60 to 65%. The left ventricle has normal function. The left ventricle has no regional wall motion abnormalities. The left ventricular internal cavity size was normal in size. There is  no left ventricular hypertrophy. Left ventricular diastolic parameters are consistent with Grade I diastolic dysfunction (impaired relaxation). Right Ventricle: Moderate Diastolic collapse of RV. The right ventricular size is normal. No increase in right ventricular wall thickness. Right ventricular systolic function is normal. Left Atrium: Left atrial size was mildly dilated. Right Atrium: Right atrial size was mildly dilated. Pericardium: A large pericardial effusion is present. The pericardial effusion is circumferential. The pericardial effusion appears to contain mixed  echogenic material. There is diastolic collapse of the right ventricular free wall, diastolic collapse of  the right atrial wall, excessive respiratory variation in the mitral valve spectral Doppler velocities and excessive respiratory variation in the tricuspid valve spectral Doppler velocities. Mitral Valve: The mitral valve is normal in structure. Mild mitral valve regurgitation. Tricuspid Valve: The tricuspid valve is normal in structure. Tricuspid valve regurgitation is mild. Aortic Valve: The aortic valve is tricuspid. Aortic valve regurgitation is not visualized. Pulmonic Valve: The pulmonic valve was normal in structure. Pulmonic valve regurgitation is trivial. Aorta: The aortic root is normal in size and structure. There is minimal (Grade I) atheroma plaque involving the aortic root. Venous: The inferior vena cava is dilated in size with less than 50% respiratory variability, suggesting right atrial pressure of 15 mmHg. IAS/Shunts: No atrial level shunt detected by color flow Doppler.  LEFT VENTRICLE PLAX 2D LVIDd:         4.30 cm   Diastology LVIDs:  no evidence of pericardial effusion. 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 Tricuspid Valve: The tricuspid valve is normal in structure. Aortic Valve: The aortic valve is normal in structure. Pulmonic Valve: The pulmonic valve was not well visualized. Aorta: The aortic root is normal in size and structure. Venous: The inferior vena cava is dilated in size with greater than 50% respiratory variability, suggesting right atrial pressure of 8 mmHg. Orpah Cobb MD Electronically signed by Orpah Cobb MD Signature Date/Time: 03/22/2023/12:52:18 PM    Final    ECHOCARDIOGRAM LIMITED  Result Date: 03/20/2023    ECHOCARDIOGRAM LIMITED REPORT   Patient Name:   Tonya Calhoun Date of Exam: 03/20/2023 Medical Rec #:  [id number]           Height:       66.0 in Accession #:    [id number]          Weight:       244.5 lb Date of Birth:  1966-03-27          BSA:          2.178 m Patient Age:    56 years            BP:           150/95 mmHg Patient Gender: F                   HR:           90 bpm. Exam Location:  Inpatient Procedure: Limited Echo Indications:    Pericardocentesis  History:        Patient has prior history of Echocardiogram examinations, most                 recent 03/20/2023.  Sonographer:    Darlys Gales Referring Phys: [id number] AJAY KADAKIA IMPRESSIONS  1. Left ventricular ejection fraction, by estimation, is 60 to 65%. The left ventricle has normal function. The left  ventricle has no regional wall motion abnormalities.  2. Right ventricular systolic function is normal. The right ventricular size is normal.  3. Left atrial size was mildly dilated.  4. Right atrial size was mildly dilated.  5. Post 1100 cc of hemorrhagic fluid drainage. There is no evidence of cardiac tamponade.  6. The mitral valve was not assessed.  7. The aortic valve was not assessed. FINDINGS  Left Ventricle: Left ventricular ejection fraction, by estimation, is 60 to 65%. The left ventricle has normal function. The left ventricle has no regional wall motion abnormalities. Right Ventricle: The right ventricular size is normal. No increase in right ventricular wall thickness. Right ventricular systolic function is normal. Left Atrium: Left atrial size was mildly dilated. Right Atrium: Right atrial size was mildly dilated. Pericardium: Post 1100 cc of hemorrhagic fluid drainage. Trivial pericardial effusion is present. There is no evidence of cardiac tamponade. Mitral Valve: The mitral valve was not assessed. Tricuspid Valve: The tricuspid valve is not assessed. Aortic Valve: The aortic valve was not assessed. Pulmonic Valve: The pulmonic valve was not assessed. Aorta: The aortic root was not well visualized. Venous: The inferior vena cava was not well visualized. Orpah Cobb MD Electronically signed by Orpah Cobb MD Signature Date/Time: 03/20/2023/6:17:06 PM    Final    ECHOCARDIOGRAM COMPLETE  Result Date: 03/20/2023    ECHOCARDIOGRAM REPORT   Patient Name:   Tonya Calhoun Date of Exam: 03/20/2023 Medical Rec #:  [id number]           Height:  Physician Discharge Summary   Patient: Tonya Calhoun MRN: [id number] DOB: 1965/12/25  Admit date:     03/19/2023  Discharge date: 03/24/2023  Discharge Physician: Briant Cedar   PCP: Avis Epley, PA-C   Recommendations at discharge:   Follow up with PCP Follow up with cardiology  Discharge Diagnoses: Principal Problem:   Pericardial effusion Active Problems:   Normocytic anemia   Essential hypertension   History of breast cancer   Obesity (BMI 30-39.9)    Hospital Course: SHARNAY DAIS is a 57 y.o. 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 In the ED, VSS except for 87% on 2 L of nasal cannula oxygen. CT of the chest was subsequently done which confirmed large hyperdense pericardial effusion suggesting proteinaceous or hemorrhagic fluid.  Cardiology consulted s/p pericardiocentesis. Pt admitted for further management.     Pt ambulated the hallway without any issues. Pt denies any SOB, chest pain. Stable to d/c and follow up with PCP and cardiology.    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 Continue outpatient follow-up with  hematology oncology   Obesity     Consultants: Cardiology Procedures performed: Pericardicentesis Disposition: Home Diet recommendation:  Cardiac diet    DISCHARGE MEDICATION: Allergies as of 03/24/2023       Reactions   Penicillins Hives   Ampicillin    Penicillins         Medication List     STOP taking these medications    celecoxib 200 MG capsule Commonly known as: CELEBREX   ibuprofen 800 MG tablet Commonly known as: ADVIL   tamsulosin 0.4 MG Caps capsule Commonly known as: FLOMAX       TAKE these medications    clobetasol ointment 0.05 % Commonly known as: TEMOVATE Apply 1 application topically 2 (two) times daily as needed for dry skin.   colchicine 0.6 MG tablet Take 1 tablet (0.6 mg total) by mouth daily. Start taking on: March 25, 2023   EPINEPHrine 0.3 mg/0.3 mL Soaj injection Commonly known as: EPI-PEN Inject 0.3 ml (1 pen) by intramuscular route once as directed for allergic reaction.   indomethacin 75 MG CR capsule Commonly known as: INDOCIN SR Take 1 capsule (75 mg total) by mouth daily with breakfast.   Iron 142 (45 Fe) MG Tbcr Take 1 tablet by mouth daily.   lisinopril 2.5 MG tablet Commonly known as: ZESTRIL Take 2.5 mg by mouth daily.   pantoprazole 40 MG tablet Commonly known as: Protonix Take 1 tablet (40 mg total) by mouth daily.   traMADol 50 MG tablet Commonly known as: ULTRAM Take 50 mg by mouth every 6 (six) hours.   Trulicity 3 MG/0.5ML Sopn Generic drug: Dulaglutide Inject 3 mg into the skin every 7 (seven)

## 2023-10-05 ENCOUNTER — Other Ambulatory Visit: Payer: Self-pay | Admitting: General Surgery

## 2023-10-05 DIAGNOSIS — Z853 Personal history of malignant neoplasm of breast: Secondary | ICD-10-CM

## 2023-10-16 ENCOUNTER — Encounter

## 2023-10-24 ENCOUNTER — Ambulatory Visit
Admission: RE | Admit: 2023-10-24 | Discharge: 2023-10-24 | Disposition: A | Discharge status: Home / Self Care | Source: Ambulatory Visit | Attending: General Surgery | Admitting: General Surgery

## 2023-10-24 DIAGNOSIS — Z853 Personal history of malignant neoplasm of breast: Secondary | ICD-10-CM

## 2024-05-06 ENCOUNTER — Other Ambulatory Visit (HOSPITAL_COMMUNITY): Payer: Self-pay
